# Patient Record
Sex: Female | Born: 1935 | Race: White | Hispanic: No
Health system: Midwestern US, Academic
[De-identification: ages and names within clinical notes are randomized; demographics above are authoritative.]

---

## 2018-04-05 LAB — COMPREHENSIVE METABOLIC PANEL
Lab: 0.7
Lab: 102 — ABNORMAL HIGH (ref 37.0–47.0)
Lab: 108
Lab: 142
Lab: 16 — ABNORMAL HIGH (ref 0–14)
Lab: 22
Lab: 22
Lab: 24 — ABNORMAL HIGH (ref 9.8–20.1)
Lab: 28
Lab: 29 — ABNORMAL LOW (ref >59)
Lab: 4.1
Lab: 7.8
Lab: 9.3

## 2018-04-05 LAB — CBC
Lab: 246
Lab: 31 — ABNORMAL LOW (ref 33.0–37.0)
Lab: 6.8

## 2018-04-06 ENCOUNTER — Encounter: Admit: 2018-04-06 | Discharge: 2018-04-06 | Payer: MEDICARE | Primary: Family

## 2018-04-07 ENCOUNTER — Encounter: Admit: 2018-04-07 | Discharge: 2018-04-07 | Payer: MEDICARE | Primary: Family

## 2018-04-10 LAB — THYROID STIMULATING HORMONE-TSH: Lab: 1.2

## 2018-04-12 ENCOUNTER — Encounter: Admit: 2018-04-12 | Discharge: 2018-04-12 | Payer: MEDICARE | Primary: Family

## 2018-04-21 ENCOUNTER — Encounter: Admit: 2018-04-21 | Discharge: 2018-04-21 | Payer: MEDICARE | Primary: Family

## 2018-04-21 DIAGNOSIS — I2510 Atherosclerotic heart disease of native coronary artery without angina pectoris: ICD-10-CM

## 2018-04-21 DIAGNOSIS — M810 Age-related osteoporosis without current pathological fracture: ICD-10-CM

## 2018-04-21 DIAGNOSIS — E871 Hypo-osmolality and hyponatremia: ICD-10-CM

## 2018-04-21 DIAGNOSIS — R079 Chest pain, unspecified: ICD-10-CM

## 2018-04-21 DIAGNOSIS — G4700 Insomnia, unspecified: ICD-10-CM

## 2018-04-21 DIAGNOSIS — E785 Hyperlipidemia, unspecified: ICD-10-CM

## 2018-04-21 DIAGNOSIS — K219 Gastro-esophageal reflux disease without esophagitis: ICD-10-CM

## 2018-04-21 DIAGNOSIS — I10 Essential (primary) hypertension: ICD-10-CM

## 2018-04-21 DIAGNOSIS — I872 Venous insufficiency (chronic) (peripheral): ICD-10-CM

## 2018-04-21 DIAGNOSIS — I350 Nonrheumatic aortic (valve) stenosis: Principal | ICD-10-CM

## 2018-04-21 DIAGNOSIS — N189 Chronic kidney disease, unspecified: ICD-10-CM

## 2018-04-21 DIAGNOSIS — F329 Major depressive disorder, single episode, unspecified: ICD-10-CM

## 2018-04-21 DIAGNOSIS — E876 Hypokalemia: ICD-10-CM

## 2018-04-21 DIAGNOSIS — E039 Hypothyroidism, unspecified: ICD-10-CM

## 2018-04-27 ENCOUNTER — Encounter: Admit: 2018-04-27 | Discharge: 2018-04-27 | Payer: MEDICARE | Primary: Family

## 2018-04-27 ENCOUNTER — Ambulatory Visit: Admit: 2018-04-27 | Discharge: 2018-04-28 | Payer: MEDICARE | Primary: Family

## 2018-04-27 DIAGNOSIS — I25119 Atherosclerotic heart disease of native coronary artery with unspecified angina pectoris: ICD-10-CM

## 2018-04-27 DIAGNOSIS — N189 Chronic kidney disease, unspecified: ICD-10-CM

## 2018-04-27 DIAGNOSIS — R072 Precordial pain: ICD-10-CM

## 2018-04-27 DIAGNOSIS — R0989 Other specified symptoms and signs involving the circulatory and respiratory systems: Secondary | ICD-10-CM

## 2018-04-27 DIAGNOSIS — Z951 Presence of aortocoronary bypass graft: ICD-10-CM

## 2018-04-27 DIAGNOSIS — F329 Major depressive disorder, single episode, unspecified: ICD-10-CM

## 2018-04-27 DIAGNOSIS — K219 Gastro-esophageal reflux disease without esophagitis: ICD-10-CM

## 2018-04-27 DIAGNOSIS — I350 Nonrheumatic aortic (valve) stenosis: Principal | ICD-10-CM

## 2018-04-27 DIAGNOSIS — E871 Hypo-osmolality and hyponatremia: ICD-10-CM

## 2018-04-27 DIAGNOSIS — M810 Age-related osteoporosis without current pathological fracture: ICD-10-CM

## 2018-04-27 DIAGNOSIS — I872 Venous insufficiency (chronic) (peripheral): ICD-10-CM

## 2018-04-27 DIAGNOSIS — E785 Hyperlipidemia, unspecified: ICD-10-CM

## 2018-04-27 DIAGNOSIS — E7800 Pure hypercholesterolemia, unspecified: ICD-10-CM

## 2018-04-27 DIAGNOSIS — R079 Chest pain, unspecified: ICD-10-CM

## 2018-04-27 DIAGNOSIS — E876 Hypokalemia: ICD-10-CM

## 2018-04-27 DIAGNOSIS — E039 Hypothyroidism, unspecified: ICD-10-CM

## 2018-04-27 DIAGNOSIS — I2510 Atherosclerotic heart disease of native coronary artery without angina pectoris: ICD-10-CM

## 2018-04-27 DIAGNOSIS — R011 Cardiac murmur, unspecified: ICD-10-CM

## 2018-04-27 DIAGNOSIS — I10 Essential (primary) hypertension: ICD-10-CM

## 2018-04-27 DIAGNOSIS — Z9989 Dependence on other enabling machines and devices: ICD-10-CM

## 2018-04-27 DIAGNOSIS — G4700 Insomnia, unspecified: ICD-10-CM

## 2018-04-27 MED ORDER — NITROGLYCERIN 0.4 MG SL SUBL
.4 mg | ORAL_TABLET | SUBLINGUAL | 1 refills | 9.00000 days | Status: AC | PRN
Start: 2018-04-27 — End: ?

## 2018-05-18 ENCOUNTER — Encounter: Admit: 2018-05-18 | Discharge: 2018-05-18 | Payer: MEDICARE | Primary: Family

## 2018-05-18 ENCOUNTER — Ambulatory Visit: Admit: 2018-05-18 | Discharge: 2018-05-19 | Payer: MEDICARE | Primary: Family

## 2018-05-18 DIAGNOSIS — I10 Essential (primary) hypertension: ICD-10-CM

## 2018-05-18 DIAGNOSIS — I25119 Atherosclerotic heart disease of native coronary artery with unspecified angina pectoris: Principal | ICD-10-CM

## 2018-05-19 ENCOUNTER — Encounter: Admit: 2018-05-19 | Discharge: 2018-05-19 | Payer: MEDICARE | Primary: Family

## 2018-05-23 ENCOUNTER — Encounter: Admit: 2018-05-23 | Discharge: 2018-05-23 | Payer: MEDICARE | Primary: Family

## 2018-05-23 DIAGNOSIS — I779 Disorder of arteries and arterioles, unspecified: Principal | ICD-10-CM

## 2018-05-25 ENCOUNTER — Ambulatory Visit: Admit: 2018-05-25 | Discharge: 2018-05-26 | Payer: MEDICARE | Primary: Family

## 2018-05-25 DIAGNOSIS — I10 Essential (primary) hypertension: Principal | ICD-10-CM

## 2018-05-25 DIAGNOSIS — I2510 Atherosclerotic heart disease of native coronary artery without angina pectoris: ICD-10-CM

## 2018-05-26 ENCOUNTER — Encounter: Admit: 2018-05-26 | Discharge: 2018-05-26 | Payer: MEDICARE | Primary: Family

## 2018-05-26 DIAGNOSIS — I10 Essential (primary) hypertension: Principal | ICD-10-CM

## 2018-05-26 DIAGNOSIS — I2510 Atherosclerotic heart disease of native coronary artery without angina pectoris: ICD-10-CM

## 2018-06-05 ENCOUNTER — Encounter: Admit: 2018-06-05 | Discharge: 2018-06-05 | Payer: MEDICARE | Primary: Family

## 2018-06-22 ENCOUNTER — Ambulatory Visit: Admit: 2018-06-22 | Discharge: 2018-06-22 | Payer: MEDICARE | Primary: Family

## 2018-06-22 ENCOUNTER — Encounter: Admit: 2018-06-22 | Discharge: 2018-06-22 | Payer: MEDICARE | Primary: Family

## 2018-06-22 DIAGNOSIS — I10 Essential (primary) hypertension: Secondary | ICD-10-CM

## 2018-06-22 DIAGNOSIS — M810 Age-related osteoporosis without current pathological fracture: Secondary | ICD-10-CM

## 2018-06-22 DIAGNOSIS — R9439 Abnormal result of other cardiovascular function study: Secondary | ICD-10-CM

## 2018-06-22 DIAGNOSIS — N189 Chronic kidney disease, unspecified: Secondary | ICD-10-CM

## 2018-06-22 DIAGNOSIS — Z951 Presence of aortocoronary bypass graft: Secondary | ICD-10-CM

## 2018-06-22 DIAGNOSIS — Z9989 Dependence on other enabling machines and devices: Secondary | ICD-10-CM

## 2018-06-22 DIAGNOSIS — R079 Chest pain, unspecified: Secondary | ICD-10-CM

## 2018-06-22 DIAGNOSIS — F329 Major depressive disorder, single episode, unspecified: Secondary | ICD-10-CM

## 2018-06-22 DIAGNOSIS — R011 Cardiac murmur, unspecified: Secondary | ICD-10-CM

## 2018-06-22 DIAGNOSIS — I25119 Atherosclerotic heart disease of native coronary artery with unspecified angina pectoris: Secondary | ICD-10-CM

## 2018-06-22 DIAGNOSIS — G4700 Insomnia, unspecified: Secondary | ICD-10-CM

## 2018-06-22 DIAGNOSIS — E785 Hyperlipidemia, unspecified: Secondary | ICD-10-CM

## 2018-06-22 DIAGNOSIS — I872 Venous insufficiency (chronic) (peripheral): Secondary | ICD-10-CM

## 2018-06-22 DIAGNOSIS — E871 Hypo-osmolality and hyponatremia: Secondary | ICD-10-CM

## 2018-06-22 DIAGNOSIS — I779 Disorder of arteries and arterioles, unspecified: Secondary | ICD-10-CM

## 2018-06-22 DIAGNOSIS — E039 Hypothyroidism, unspecified: Secondary | ICD-10-CM

## 2018-06-22 DIAGNOSIS — I2510 Atherosclerotic heart disease of native coronary artery without angina pectoris: Secondary | ICD-10-CM

## 2018-06-22 DIAGNOSIS — I350 Nonrheumatic aortic (valve) stenosis: Secondary | ICD-10-CM

## 2018-06-22 DIAGNOSIS — K219 Gastro-esophageal reflux disease without esophagitis: Secondary | ICD-10-CM

## 2018-06-22 DIAGNOSIS — E876 Hypokalemia: Secondary | ICD-10-CM

## 2018-06-22 DIAGNOSIS — E7800 Pure hypercholesterolemia, unspecified: Secondary | ICD-10-CM

## 2018-10-04 ENCOUNTER — Encounter: Admit: 2018-10-04 | Discharge: 2018-10-04 | Payer: MEDICARE | Primary: Family

## 2018-10-06 ENCOUNTER — Encounter: Admit: 2018-10-06 | Discharge: 2018-10-06 | Payer: MEDICARE | Primary: Family

## 2018-10-06 NOTE — Telephone Encounter
Called pt to schedule her followup appt with Dr. Avie Arenas which is due 8 - 10 months after her Jan 2020 appt. Pt says she does not have a computer or email address. Scheduled appt for office visit with Dr. Avie Arenas on 01/16/2019 at 10:30 in our [place] office.

## 2019-01-25 ENCOUNTER — Encounter: Admit: 2019-01-25 | Discharge: 2019-01-25 | Primary: Family

## 2019-02-01 ENCOUNTER — Encounter: Admit: 2019-02-01 | Discharge: 2019-02-01 | Primary: Family

## 2019-02-01 ENCOUNTER — Ambulatory Visit: Admit: 2019-02-01 | Discharge: 2019-02-02 | Primary: Family

## 2019-02-01 DIAGNOSIS — I10 Essential (primary) hypertension: Secondary | ICD-10-CM

## 2019-02-01 DIAGNOSIS — R9439 Abnormal result of other cardiovascular function study: Secondary | ICD-10-CM

## 2019-02-01 DIAGNOSIS — E039 Hypothyroidism, unspecified: Secondary | ICD-10-CM

## 2019-02-01 DIAGNOSIS — G4700 Insomnia, unspecified: Secondary | ICD-10-CM

## 2019-02-01 DIAGNOSIS — F329 Major depressive disorder, single episode, unspecified: Secondary | ICD-10-CM

## 2019-02-01 DIAGNOSIS — I350 Nonrheumatic aortic (valve) stenosis: Secondary | ICD-10-CM

## 2019-02-01 DIAGNOSIS — E876 Hypokalemia: Secondary | ICD-10-CM

## 2019-02-01 DIAGNOSIS — Z951 Presence of aortocoronary bypass graft: Secondary | ICD-10-CM

## 2019-02-01 DIAGNOSIS — I872 Venous insufficiency (chronic) (peripheral): Secondary | ICD-10-CM

## 2019-02-01 DIAGNOSIS — E871 Hypo-osmolality and hyponatremia: Secondary | ICD-10-CM

## 2019-02-01 DIAGNOSIS — R0989 Other specified symptoms and signs involving the circulatory and respiratory systems: Secondary | ICD-10-CM

## 2019-02-01 DIAGNOSIS — N189 Chronic kidney disease, unspecified: Secondary | ICD-10-CM

## 2019-02-01 DIAGNOSIS — R011 Cardiac murmur, unspecified: Secondary | ICD-10-CM

## 2019-02-01 DIAGNOSIS — Z9989 Dependence on other enabling machines and devices: Secondary | ICD-10-CM

## 2019-02-01 DIAGNOSIS — R079 Chest pain, unspecified: Secondary | ICD-10-CM

## 2019-02-01 DIAGNOSIS — K219 Gastro-esophageal reflux disease without esophagitis: Secondary | ICD-10-CM

## 2019-02-01 DIAGNOSIS — I2510 Atherosclerotic heart disease of native coronary artery without angina pectoris: Secondary | ICD-10-CM

## 2019-02-01 DIAGNOSIS — M810 Age-related osteoporosis without current pathological fracture: Secondary | ICD-10-CM

## 2019-02-01 DIAGNOSIS — I25119 Atherosclerotic heart disease of native coronary artery with unspecified angina pectoris: Secondary | ICD-10-CM

## 2019-02-01 DIAGNOSIS — I779 Disorder of arteries and arterioles, unspecified: Secondary | ICD-10-CM

## 2019-02-01 DIAGNOSIS — E7800 Pure hypercholesterolemia, unspecified: Secondary | ICD-10-CM

## 2019-02-01 DIAGNOSIS — E785 Hyperlipidemia, unspecified: Secondary | ICD-10-CM

## 2019-02-01 NOTE — Progress Notes
Date of Service: 02/01/2019    Lori Harper is a 84 y.o. female.       HPI     Lori Harper is an 84 year old white female.  She does have a history of coronary artery disease.  This patient did undergo CABG on 10/30/1988 (LIMA to LAD, SVG to OM branch and diagonal branch and also SVG to RCA).      She was evaluated with a MPI on 05/25/2018, the tomographic pattern demonstrated a large lateral wall perfusion abnormality that was compatible with scar with a very subtle area of reversibility at the low ventricular apex, there are no large areas of ischemia, the left ventricular systolic function was normal with an ejection fraction of 62%.    Patient did undergo several left heart catheterizations in March 2000, October 2002, August 2004, February 2007, March 2008.   ???  ???The most recent left heart catheterization was performed in October 2009, at that time she was found to have severe three-vessel coronary artery disease, the LIMA to LAD was patent, the SVG graft to RCA was occluded, patent SVG to the OM branch with occlusion of the second limb going to OM 2.  ???  Patient also underwent PCI of the SVG to diagonal and obtuse marginal branch in October 2002, then a repeat PCI in August 2004 for instent  restenosis, then repeated PCI's in May 2006 of the SVG of the grafts to obtuse marginal branches, another PCI in February 2007 of the SVG to diagonal as well as the native left circumflex artery. ???In one  of the records from the outside hospital it is also mentioned that patient did have a redo bypass in 1999, details are not known.    She was also evaluated with an echocardiogram on 05/18/2018, the ejection fraction was normal, the aortic valve was mildly sclerotic with mildly increased mean gradient, at that time it was 15 mmHg.    Patient is asymptomatic from a cardiac standpoint, she not experience angina and has not taken any sublingual nitroglycerin. The most recent cholesterol profile dated 01/08/2019 revealed a total cholesterol 185 mg/dL, triglycerides 161 mg/dL, HDL 40 mg/dL, and LDL 87 mg/dL.  Patient is currently on statin therapy.         Vitals:    02/01/19 0954 02/01/19 1008   BP: 136/76 138/78   BP Source: Arm, Left Upper Arm, Right Upper   Pulse: 64    Temp: 36.4 ???C (97.5 ???F)    SpO2: 98%    Weight: 61 kg (134 lb 6.4 oz)    Height: 1.524 m (5')    PainSc: Zero      Body mass index is 26.25 kg/m???.     Past Medical History  Patient Active Problem List    Diagnosis Date Noted   ??? Abnormal thallium stress test 06/22/2018   ??? Carotid artery disease (HCC) 05/23/2018     05/18/2018 Carotid duplex Right:  There is calcified plaque at the proximal internal carotid artery causing less than 50 percent stenosis.  There is scattered noncalcified and calcified plaque causing less than 50 percent stenosis along the common carotid artery.  Left:  There is heterogeneous calcified and noncalcified plaque at the carotid bulb and extending into the proximal internal carotid artery causing less than 50 percent estimated grayscale stenosis.       ??? Hx of CABG 04/27/2018   ??? Systolic murmur 04/27/2018   ??? Bruit of left carotid artery 04/27/2018   ??? Uses walker  04/27/2018   ??? Aortic stenosis 04/21/2018     01/2005 Echo showed normal LV systolic function. LV is normal in size.  Impaired LV relaxation.  Left atrium is mildly dilated.  There is mild mitral regurgitation by color flow.  There is mild mitral annular calcification      ??? Coronary artery disease 04/21/2018     04/2000    1999 quintuple coronary artery bypass surgery including left internal mammary artery to the left anterior descending, sequential saphenous vein grafts to the first obtuse marginal, second obtuse marginal and first diagonal arteries, and a separate saphenous vein graft to the right coronary artery.    1999 Acute inferior wall MI.  07/1997  Occluded saphenous vein graft to the RCA. 04/2000  Occluded graft to the RCA  03/2001  Angioplasty with deployment of a total of four stents.  01/2203  Thallium showed inferior lateral and apical defect with mid to mod degree of ischemia suggesting peri-infarct ischemia.   01/2003  Cath demonstrated occlusion of all epicardial coronary arteries, patent left internal mammary artery graft to the LAD, patient saphenous vein graft to the first obtuse marginal with occluded sequential arms to the second obtuse marginal and to the diagonal artery, occluded saphenous vein graft to the RCA.  The patient has been treated medically.         ??? Chronic kidney disease 04/21/2018   ??? GERD (gastroesophageal reflux disease) 04/21/2018   ??? Hypertension 04/21/2018   ??? Hyperlipidemia 04/21/2018   ??? Hypo-osmolality and hyponatremia 04/21/2018   ??? Hypokalemia 04/21/2018   ??? Hypothyroidism 04/21/2018   ??? Insomnia 04/21/2018   ??? MDD (major depressive disorder) 04/21/2018   ??? Osteoporosis 04/21/2018   ??? Venous insufficiency 04/21/2018   ??? Chest pain 04/21/2018         Review of Systems   Constitution: Negative.   HENT: Positive for hearing loss.    Eyes: Negative.    Cardiovascular: Positive for irregular heartbeat.   Respiratory: Negative.    Endocrine: Negative.    Hematologic/Lymphatic: Negative.    Skin: Negative.    Musculoskeletal: Positive for arthritis, falls, joint pain and joint swelling.   Gastrointestinal: Negative.    Genitourinary: Negative.    Neurological: Negative.    Psychiatric/Behavioral: The patient is nervous/anxious.    Allergic/Immunologic: Negative.        Physical Exam    General Appearance: normal in appearance  Skin: warm, moist, no ulcers or xanthomas  Eyes: conjunctivae and lids normal, pupils are equal and round  Lips & Oral Mucosa: no pallor or cyanosis  Neck Veins: neck veins are flat, neck veins are not distended  Chest Inspection: chest is normal in appearance  Respiratory Effort: breathing comfortably, no respiratory distress Auscultation/Percussion: lungs clear to auscultation, no rales or rhonchi, no wheezing  Cardiac Rhythm: regular rhythm and normal rate  Cardiac Auscultation: S1, S2 normal, no rub, no gallop  Murmurs: II.VI systolic murmur at the RUSB  Carotid Arteries: normal carotid upstroke bilaterally, L bruit  Lower Extremity Edema: no lower extremity edema  Abdominal Exam: soft, non-tender, no masses, bowel sounds normal  Liver & Spleen: no organomegaly  Language and Memory: patient responsive and seems to comprehend information  Neurologic Exam: neurological assessment grossly intact    Cardiovascular Studies      Problems Addressed Today  Encounter Diagnoses   Name Primary?   ??? Nonrheumatic aortic valve stenosis Yes   ??? Coronary artery disease involving native heart with angina pectoris, unspecified vessel  or lesion type [facility])    ??? Essential hypertension    ??? Pure hypercholesterolemia    ??? Hx of CABG    ??? Carotid artery disease, unspecified laterality, unspecified type (HCC)    ??? Chronic kidney disease, unspecified CKD stage    ??? Systolic murmur    ??? Bruit of left carotid artery    ??? Uses walker    ??? Abnormal thallium stress test        Assessment and Plan     In summary:  This is an 84 year old white female with an extensive history of coronary artery disease, surgical revascularization in Oct 30, 1988 and percutaneous revascularization performed several times in the years to follow, patient is known to have an occluded SVG graft to RCA, a jump graft from an SVG to the second limb of the OM 2 branch is known to be occluded, patient also did undergo previous PCI of the SVG to diagonal obtuse marginal branches.  ???  The most recent thallium stress test performed did reveal lateral wall scar which is compatible with patient's anatomy.    From a cardiac standpoint this patient has remained asymptomatic, she does have osteoarthritis related symptoms.  She is also known to have a left carotid bruit.    Plan: 1.  Continue all current medications  2.  Further evaluation with a carotid artery duplex and 2D echo Doppler study prior to the next office visit with me in approximately 6 to 7 months.  3.  In regards to the elevated triglycerides I did asked the patient to follow a low-fat, low carbohydrate, low sugar and low-salt diet, she can have the cholesterol profile rechecked in 3 months, if the triglycerides are not improved then I suggest to initiate fenofibrate 145 mg 1 tablet p.o. daily.           Current Medications (including today's revisions)  ??? alendronate (FOSAMAX) 70 mg tablet Take 70 mg by mouth every 7 days. Take at least 30 minutes before breakfast with plain water. Do not lie down for 30 minutes.   ??? aspirin EC 81 mg tablet Take 81 mg by mouth daily. Take with food.   ??? buPROPion XL (WELLBUTRIN XL) 300 mg tablet Take 300 mg by mouth daily.   ??? busPIRone (BUSPAR) 7.5 mg tablet Take 7.5 mg by mouth twice daily.   ??? cholecalciferol(+) (VITAMIN D-3) 2,000 unit tablet Take 2,000 Units by mouth daily.   ??? fluticasone propionate (FLONASE) 50 mcg/actuation nasal spray Apply 2 sprays to each nostril as directed daily. Shake bottle gently before using.   ??? furosemide (LASIX) 40 mg tablet Take 40 mg by mouth daily.   ??? gabapentin (NEURONTIN) 300 mg capsule Take 1 capsule by mouth at bedtime daily.   ??? isosorbide mononitrate CR (IMDUR) 120 mg tablet Take 120 mg by mouth twice daily.   ??? KLOR-CON M20 20 mEq tablet Take 20 mEq by mouth daily.   ??? levothyroxine (SYNTHROID) 50 mcg tablet Take 50 mcg by mouth daily.   ??? loratadine (CLARITIN) 10 mg tablet Take 10 mg by mouth every morning.   ??? LORazepam (ATIVAN) 0.5 mg tablet Take 1 tablet by mouth twice daily.   ??? magnesium hydroxide (MILK OF MAGNESIA PO) Take  by mouth as Needed.   ??? metoprolol tartrate (LOPRESSOR) 50 mg tablet TAKE 1 TABLET BY MOUTH IN THE MORNING AND 1/2 TABS ON THE EVENING   ??? multivit,calc,mins/folic acid (ONE-A-DAY PROACTIVE 65 PLUS PO) Take 1 tablet by  mouth daily.   ??? nitroglycerin (NITROSTAT) 0.4 mg tablet Place one tablet under tongue every 5 minutes as needed for Chest Pain. Max of 3 tablets, call 911.   ??? Omega-3-DHA-EPA-Fish Oil 1,000 mg (120 mg-180 mg) cap Take 1 capsule by mouth daily.   ??? Simethicone 125 mg cap Take 1 capsule by mouth daily.   ??? simvastatin (ZOCOR) 20 mg tablet Take 20 mg by mouth daily.   ??? tiZANidine (ZANAFLEX) 4 mg tablet Take 4 mg by mouth every 8 hours as needed.   ??? Trolamine Salicylate-Aloe Vera 10 % crea Apply  topically to affected area.   ??? Vit  A,C,E-Zinc-Copper (PRESERVISION AREDS) 14,[id number] unit-mg-unit cap Take 1 capsule by mouth daily.

## 2019-09-06 ENCOUNTER — Encounter: Admit: 2019-09-06 | Discharge: 2019-09-06 | Payer: MEDICARE | Primary: Family

## 2019-09-07 ENCOUNTER — Encounter: Admit: 2019-09-07 | Discharge: 2019-09-07 | Payer: MEDICARE | Primary: Family

## 2019-09-07 ENCOUNTER — Ambulatory Visit: Admit: 2019-09-07 | Discharge: 2019-09-07 | Payer: MEDICARE | Primary: Family

## 2019-09-07 DIAGNOSIS — Z951 Presence of aortocoronary bypass graft: Secondary | ICD-10-CM

## 2019-09-07 DIAGNOSIS — I350 Nonrheumatic aortic (valve) stenosis: Secondary | ICD-10-CM

## 2019-09-07 DIAGNOSIS — N189 Chronic kidney disease, unspecified: Secondary | ICD-10-CM

## 2019-09-07 DIAGNOSIS — R0989 Other specified symptoms and signs involving the circulatory and respiratory systems: Secondary | ICD-10-CM

## 2019-09-07 DIAGNOSIS — I10 Essential (primary) hypertension: Secondary | ICD-10-CM

## 2019-09-07 DIAGNOSIS — R9439 Abnormal result of other cardiovascular function study: Secondary | ICD-10-CM

## 2019-09-07 DIAGNOSIS — E785 Hyperlipidemia, unspecified: Secondary | ICD-10-CM

## 2019-09-07 DIAGNOSIS — E7800 Pure hypercholesterolemia, unspecified: Secondary | ICD-10-CM

## 2019-09-07 DIAGNOSIS — I25119 Atherosclerotic heart disease of native coronary artery with unspecified angina pectoris: Secondary | ICD-10-CM

## 2019-09-07 DIAGNOSIS — R079 Chest pain, unspecified: Secondary | ICD-10-CM

## 2019-09-07 DIAGNOSIS — M79606 Pain in leg, unspecified: Secondary | ICD-10-CM

## 2019-09-07 DIAGNOSIS — I779 Disorder of arteries and arterioles, unspecified: Secondary | ICD-10-CM

## 2019-09-07 DIAGNOSIS — G4700 Insomnia, unspecified: Secondary | ICD-10-CM

## 2019-09-07 DIAGNOSIS — K219 Gastro-esophageal reflux disease without esophagitis: Secondary | ICD-10-CM

## 2019-09-07 DIAGNOSIS — I872 Venous insufficiency (chronic) (peripheral): Secondary | ICD-10-CM

## 2019-09-07 DIAGNOSIS — Z9989 Dependence on other enabling machines and devices: Secondary | ICD-10-CM

## 2019-09-07 DIAGNOSIS — E039 Hypothyroidism, unspecified: Secondary | ICD-10-CM

## 2019-09-07 DIAGNOSIS — M810 Age-related osteoporosis without current pathological fracture: Secondary | ICD-10-CM

## 2019-09-07 DIAGNOSIS — R011 Cardiac murmur, unspecified: Secondary | ICD-10-CM

## 2019-09-07 DIAGNOSIS — E871 Hypo-osmolality and hyponatremia: Secondary | ICD-10-CM

## 2019-09-07 DIAGNOSIS — F329 Major depressive disorder, single episode, unspecified: Secondary | ICD-10-CM

## 2019-09-07 DIAGNOSIS — E876 Hypokalemia: Secondary | ICD-10-CM

## 2019-09-07 DIAGNOSIS — I2510 Atherosclerotic heart disease of native coronary artery without angina pectoris: Secondary | ICD-10-CM

## 2019-09-07 MED ORDER — PERFLUTREN LIPID MICROSPHERES 1.1 MG/ML IV SUSP
1-20 mL | Freq: Once | INTRAVENOUS | 0 refills | Status: CP | PRN
Start: 2019-09-07 — End: ?

## 2019-09-19 ENCOUNTER — Encounter: Admit: 2019-09-19 | Discharge: 2019-09-19 | Payer: MEDICARE | Primary: Family

## 2020-04-07 ENCOUNTER — Encounter: Admit: 2020-04-07 | Discharge: 2020-04-07 | Payer: MEDICARE | Primary: Family

## 2020-04-07 MED ORDER — AMLODIPINE 10 MG PO TAB
10 mg | ORAL_TABLET | Freq: Every day | ORAL | 3 refills | Status: AC
Start: 2020-04-07 — End: ?

## 2020-04-07 NOTE — Telephone Encounter
Pt has hx of 4 ER visits with asymptomatic hypertension in the evening with bp's 200's /100's P60's.  Pt's appt 11/4 [facility] notified orders received. Pt notified.

## 2020-04-16 ENCOUNTER — Encounter: Admit: 2020-04-16 | Discharge: 2020-04-16 | Payer: MEDICARE | Primary: Family

## 2020-04-16 NOTE — Progress Notes
Records Request    Medical records request for continuation of care:    Patient has appointment on 04/17/2020   with  Dr. Nickolas Madrid* .    Please fax records to Cardiovascular Medicine [facility] [phone]    Request records: Lori Harper          EKG's (August-November 2021)          Most recent Lipid Panel              Thank you,      Cardiovascular Medicine  [facility]  [address]  [place], [place] [address]  Phone:  [phone]  Fax:  [phone]

## 2020-04-17 ENCOUNTER — Encounter: Admit: 2020-04-17 | Discharge: 2020-04-17 | Payer: MEDICARE | Primary: Family

## 2020-04-17 DIAGNOSIS — I779 Disorder of arteries and arterioles, unspecified: Secondary | ICD-10-CM

## 2020-04-17 DIAGNOSIS — M810 Age-related osteoporosis without current pathological fracture: Secondary | ICD-10-CM

## 2020-04-17 DIAGNOSIS — Z7722 Contact with and (suspected) exposure to environmental tobacco smoke (acute) (chronic): Secondary | ICD-10-CM

## 2020-04-17 DIAGNOSIS — F329 Major depressive disorder, single episode, unspecified: Secondary | ICD-10-CM

## 2020-04-17 DIAGNOSIS — I10 Essential (primary) hypertension: Secondary | ICD-10-CM

## 2020-04-17 DIAGNOSIS — I872 Venous insufficiency (chronic) (peripheral): Secondary | ICD-10-CM

## 2020-04-17 DIAGNOSIS — E785 Hyperlipidemia, unspecified: Secondary | ICD-10-CM

## 2020-04-17 DIAGNOSIS — Z09 Encounter for follow-up examination after completed treatment for conditions other than malignant neoplasm: Secondary | ICD-10-CM

## 2020-04-17 DIAGNOSIS — R0989 Other specified symptoms and signs involving the circulatory and respiratory systems: Secondary | ICD-10-CM

## 2020-04-17 DIAGNOSIS — Z9989 Dependence on other enabling machines and devices: Secondary | ICD-10-CM

## 2020-04-17 DIAGNOSIS — E871 Hypo-osmolality and hyponatremia: Secondary | ICD-10-CM

## 2020-04-17 DIAGNOSIS — G4700 Insomnia, unspecified: Secondary | ICD-10-CM

## 2020-04-17 DIAGNOSIS — N189 Chronic kidney disease, unspecified: Secondary | ICD-10-CM

## 2020-04-17 DIAGNOSIS — I2510 Atherosclerotic heart disease of native coronary artery without angina pectoris: Secondary | ICD-10-CM

## 2020-04-17 DIAGNOSIS — I350 Nonrheumatic aortic (valve) stenosis: Secondary | ICD-10-CM

## 2020-04-17 DIAGNOSIS — I25119 Atherosclerotic heart disease of native coronary artery with unspecified angina pectoris: Secondary | ICD-10-CM

## 2020-04-17 DIAGNOSIS — K219 Gastro-esophageal reflux disease without esophagitis: Secondary | ICD-10-CM

## 2020-04-17 DIAGNOSIS — Z951 Presence of aortocoronary bypass graft: Secondary | ICD-10-CM

## 2020-04-17 DIAGNOSIS — E7800 Pure hypercholesterolemia, unspecified: Secondary | ICD-10-CM

## 2020-04-17 DIAGNOSIS — R9439 Abnormal result of other cardiovascular function study: Secondary | ICD-10-CM

## 2020-04-17 DIAGNOSIS — R079 Chest pain, unspecified: Secondary | ICD-10-CM

## 2020-04-17 DIAGNOSIS — R011 Cardiac murmur, unspecified: Secondary | ICD-10-CM

## 2020-04-17 DIAGNOSIS — E039 Hypothyroidism, unspecified: Secondary | ICD-10-CM

## 2020-04-17 DIAGNOSIS — E876 Hypokalemia: Secondary | ICD-10-CM

## 2020-04-17 NOTE — Progress Notes
Date of Service: 04/17/2020    Lori Harper is a 85 y.o. female.       HPI     Since August 2021 Lori Harper was admitted/evaluated in the emergency room department at the local hospital 6 times between January 24, 2020 and 04/13/2020.    During the admission and August, patient also reported chest pain, she was treated with enoxaparin for 48 hours, aspirin beta-blocker and also an echocardiogram was performed along with a perfusion imaging study.  The echocardiogram showed normal heart function, the aortic valve is known to be sclerotic and mildly stenotic with a mean gradient of 6 mmHg.    The MPI dated 01/22/2020 demonstrated a fixed lateral wall perfusion abnormality, this is similar to a previous study performed in February 2019 and compatible with patient's known history of underlying coronary artery disease.    The other admissions and ER department evaluation occurred on 03/08/2020, 03/15/2020, 03/27/2020 04/03/2020 and 04/13/2020.  These ER evaluations were prompted by symptoms of dizziness, weakness, chest pain, heart palpitations.  The blood pressure ranged between 159/67 mmHg to 205/90 mmHg.  Patient was treated in the emergency room with antihypertensive drug therapy and she was discharged home in stable condition on each of these occasions.    During the last presentation to the emergency room department I was contacted by the ER staff and I suggested to add amlodipine 5 mg p.o. twice daily.  At present time patient is not on this medication.    Today here in the office Lori Harper reports doing well from a cardiac standpoint.  She has not had any symptoms of chest pain, she has not experience heart palpitations.  Blood pressure today is 124/66 mmHg in the left upper extremity and 124/62 mmHg in the right upper extremity with a heart rate of 62 bpm.    At present time patient does take furosemide, isosorbide mononitrate metoprolol and statin therapy.    She is also exposed to heavy secondhand smoking, patient does live with her son and her granddaughter.    Evaluation with a carotid duplex on 09/07/2019 demonstrated atheromatous plaque in bilateral common carotid arteries without any significant stenosis, mild to moderate calcified/heterogeneous plaque was noted in the proximal carotid arteries with mild flow acceleration.    Patient did undergo several left heart catheterizations in March 2000, October 2002, August 2004, February 2007, March 2008.   ?  ?The most recent left heart catheterization was performed in October 2009, at that time she was found to have severe three-vessel coronary artery disease, the LIMA to LAD was patent, the SVG graft to RCA was occluded, patent SVG to the OM branch with occlusion of the second limb going to OM 2.  ?  Patient also underwent PCI of the SVG to diagonal and obtuse marginal branch in October 2002, then a repeat PCI in August 2004 for?instent??restenosis, then repeated PCI's in May 2006 of the SVG of the grafts to obtuse marginal branches, another PCI in February 2007 of the SVG to diagonal as well as the native left circumflex artery. ?In?one??of the records from the outside hospital it is also mentioned that patient did have a redo bypass in 1999, details are not known.         Vitals:    04/17/20 1434 04/17/20 1446   BP: 124/66 124/62   BP Source: Arm, Left Upper Arm, Right Upper   Patient Position: Sitting Sitting   Pulse: 62    SpO2: 98%  Weight: 64 kg (141 lb 3.2 oz)    Height: 1.499 m (4' 11)    PainSc: Zero      Body mass index is 28.52 kg/m?Marland Kitchen     Past Medical History  Patient Active Problem List    Diagnosis Date Noted   ? Abnormal thallium stress test 06/22/2018   ? Carotid artery disease (HCC) 05/23/2018     05/18/2018 Carotid duplex Right:  There is calcified plaque at the proximal internal carotid artery causing less than 50 percent stenosis.  There is scattered noncalcified and calcified plaque causing less than 50 percent stenosis along the common carotid artery.  Left:  There is heterogeneous calcified and noncalcified plaque at the carotid bulb and extending into the proximal internal carotid artery causing less than 50 percent estimated grayscale stenosis.       ? Hx of CABG 04/27/2018   ? Systolic murmur 04/27/2018   ? Bruit of left carotid artery 04/27/2018   ? Walker as ambulation aid 04/27/2018   ? Aortic stenosis 04/21/2018     01/2005 Echo showed normal LV systolic function. LV is normal in size.  Impaired LV relaxation.  Left atrium is mildly dilated.  There is mild mitral regurgitation by color flow.  There is mild mitral annular calcification      ? Coronary artery disease 04/21/2018     04/2000    1999 quintuple coronary artery bypass surgery including left internal mammary artery to the left anterior descending, sequential saphenous vein grafts to the first obtuse marginal, second obtuse marginal and first diagonal arteries, and a separate saphenous vein graft to the right coronary artery.    1999 Acute inferior wall MI.  07/1997  Occluded saphenous vein graft to the RCA.   04/2000  Occluded graft to the RCA  03/2001  Angioplasty with deployment of a total of four stents.  01/2203  Thallium showed inferior lateral and apical defect with mid to mod degree of ischemia suggesting peri-infarct ischemia.   01/2003  Cath demonstrated occlusion of all epicardial coronary arteries, patent left internal mammary artery graft to the LAD, patient saphenous vein graft to the first obtuse marginal with occluded sequential arms to the second obtuse marginal and to the diagonal artery, occluded saphenous vein graft to the RCA.  The patient has been treated medically.         ? Chronic kidney disease 04/21/2018   ? GERD (gastroesophageal reflux disease) 04/21/2018   ? Hypertension 04/21/2018   ? Hyperlipidemia 04/21/2018   ? Hypo-osmolality and hyponatremia 04/21/2018   ? Hypokalemia 04/21/2018   ? Hypothyroidism 04/21/2018   ? Insomnia 04/21/2018   ? MDD (major depressive disorder) 04/21/2018   ? Osteoporosis 04/21/2018   ? Venous insufficiency 04/21/2018   ? Chest pain 04/21/2018         Review of Systems   Constitutional: Positive for malaise/fatigue.   HENT: Positive for tinnitus.    Eyes: Negative.    Cardiovascular: Positive for leg swelling.   Respiratory: Negative.    Endocrine: Negative.    Hematologic/Lymphatic: Negative.    Skin: Negative.    Musculoskeletal: Positive for gout.   Gastrointestinal: Negative.    Genitourinary: Negative.    Neurological: Positive for excessive daytime sleepiness and dizziness.   Psychiatric/Behavioral: Positive for depression and memory loss.   Allergic/Immunologic: Negative.        Physical Exam  General Appearance: normal in appearance , patient uses a walker as an ambulatory aid  Skin: warm, moist,  no ulcers or xanthomas  Eyes: conjunctivae and lids normal, pupils are equal and round  Lips & Oral Mucosa: no pallor or cyanosis  Neck Veins: neck veins are flat, neck veins are not distended  Chest Inspection: chest is normal in appearance  Respiratory Effort: breathing comfortably, no respiratory distress  Auscultation/Percussion: lungs clear to auscultation, no rales or rhonchi, no wheezing  Cardiac Rhythm: regular rhythm and normal rate  Cardiac Auscultation: S1, S2 normal, no rub, no gallop  Murmurs: 2/6 systolic murmur at the right upper sternal border  Carotid Arteries: normal carotid upstroke bilaterally, bilateral carotid bruit  Lower Extremity Edema: no lower extremity edema  Abdominal Exam: soft, non-tender, no masses, bowel sounds normal  Liver & Spleen: no organomegaly  Language and Memory: patient responsive and seems to comprehend information  Neurologic Exam: neurological assessment grossly intact      Cardiovascular Studies      Cardiovascular Health Factors  Vitals BP Readings from Last 3 Encounters:   04/17/20 124/62   09/07/19 (!) 157/88   09/07/19 (!) 150/87     Wt Readings from Last 3 Encounters:   04/17/20 64 kg (141 lb 3.2 oz)   09/07/19 59.2 kg (130 lb 9.6 oz)   09/07/19 59.2 kg (130 lb 9.6 oz)     BMI Readings from Last 3 Encounters:   04/17/20 28.52 kg/m?   09/07/19 26.38 kg/m?   09/07/19 26.38 kg/m?      Smoking Social History     Tobacco Use   Smoking Status Never Smoker   Smokeless Tobacco Never Used      Lipid Profile Cholesterol   Date Value Ref Range Status   01/08/2019 185  Final     HDL   Date Value Ref Range Status   01/08/2019 40  Final     LDL   Date Value Ref Range Status   01/08/2019 87  Final     Triglycerides   Date Value Ref Range Status   01/08/2019 289 (H) <150 Final      Blood Sugar No results found for: HGBA1C  Glucose   Date Value Ref Range Status   08/14/2019 87  Final   01/08/2019 93  Final   04/05/2018 105  Final          Problems Addressed Today  Encounter Diagnoses   Name Primary?   ? Nonrheumatic aortic valve stenosis Yes   ? Carotid artery disease, unspecified laterality, unspecified type (HCC)    ? Coronary artery disease involving native heart with angina pectoris, unspecified vessel or lesion type (HCC)    ? Hx of CABG    ? Pure hypercholesterolemia    ? Primary hypertension    ? Venous insufficiency    ? Abnormal thallium stress test    ? Bruit of left carotid artery    ? Chronic kidney disease, unspecified CKD stage    ? Walker as ambulation aid    ? Systolic murmur    ? Hospital discharge follow-up    ? Secondhand exposure to electronic cigarette smoke        Assessment and Plan     In summary: This is an 85 year old white female that underwent hospitalization in August and then few subsequent ER department evaluation due to poorly controlled hypertension, please see above details.    At present time patient remained stable from a cardiac standpoint, her blood pressure is under good control, she is angina free and essentially asymptomatic.    Plan:  1.  I recommended to continue all current cardiac medications  2.  Follow-up office visit in 3 months.      Total Time Today was 45 minutes in the following activities: Preparing to see the patient, Obtaining and/or reviewing separately obtained history, Performing a medically appropriate examination and/or evaluation, Counseling and educating the patient/family/caregiver, Documenting clinical information in the electronic or other health record and Care coordination (not separately reported)                  Current Medications (including today's revisions)  ? allopurinoL (ZYLOPRIM) 100 mg tablet Take 1 tablet by mouth daily.   ? aspirin EC 81 mg tablet Take 81 mg by mouth daily. Take with food.   ? buPROPion XL (WELLBUTRIN XL) 300 mg tablet Take 300 mg by mouth daily.   ? busPIRone (BUSPAR) 7.5 mg tablet Take 7.5 mg by mouth twice daily.   ? cholecalciferol(+) (VITAMIN D-3) 2,000 unit tablet Take 2,000 Units by mouth daily.   ? diclofenac (VOLTAREN) 1 % topical gel Apply 4 g topically to affected area four times daily.   ? famotidine (PEPCID) 40 mg tablet Take 40 mg by mouth daily.   ? fluticasone propionate (FLONASE) 50 mcg/actuation nasal spray Apply 2 sprays to each nostril as directed daily. Shake bottle gently before using.   ? furosemide (LASIX) 40 mg tablet Take 40 mg by mouth daily.   ? gabapentin (NEURONTIN) 300 mg capsule Take 1 capsule by mouth twice daily. Patient takes 1 capsule in the morning and 2 capsules in the evening   ? isosorbide mononitrate CR (IMDUR) 120 mg tablet Take 120 mg by mouth twice daily.   ? KLOR-CON M20 20 mEq tablet Take 20 mEq by mouth daily.   ? levothyroxine (SYNTHROID) 50 mcg tablet Take 50 mcg by mouth daily.   ? loratadine (CLARITIN) 10 mg tablet Take 10 mg by mouth every morning.   ? LORazepam (ATIVAN) 0.5 mg tablet Take 1 tablet by mouth every 12 hours as needed.   ? metoprolol tartrate (LOPRESSOR) 50 mg tablet TAKE 1 TABLET BY MOUTH IN THE MORNING AND 1/2 TABS ON THE EVENING   ? multivit,calc,mins/folic acid (ONE-A-DAY PROACTIVE 65 PLUS PO) Take 1 tablet by mouth daily.   ? nitroglycerin (NITROSTAT) 0.4 mg tablet Place one tablet under tongue every 5 minutes as needed for Chest Pain. Max of 3 tablets, call 911.   ? Omega-3-DHA-EPA-Fish Oil 1,000 mg (120 mg-180 mg) cap Take 1 capsule by mouth daily.   ? simvastatin (ZOCOR) 20 mg tablet Take 20 mg by mouth daily.   ? Trolamine Salicylate-Aloe Vera 10 % crea Apply  topically to affected area as Needed.   ? Vit  A,C,E-Zinc-Copper (PRESERVISION AREDS) 14,320-226-200 unit-mg-unit cap Take 1 capsule by mouth daily.

## 2020-04-22 ENCOUNTER — Encounter: Admit: 2020-04-22 | Discharge: 2020-04-22 | Payer: MEDICARE | Primary: Family

## 2020-06-15 IMAGING — CR [ID]
1 series · 1 of 1 positions shown · non-contrast
Comparison: none

[chest ap]
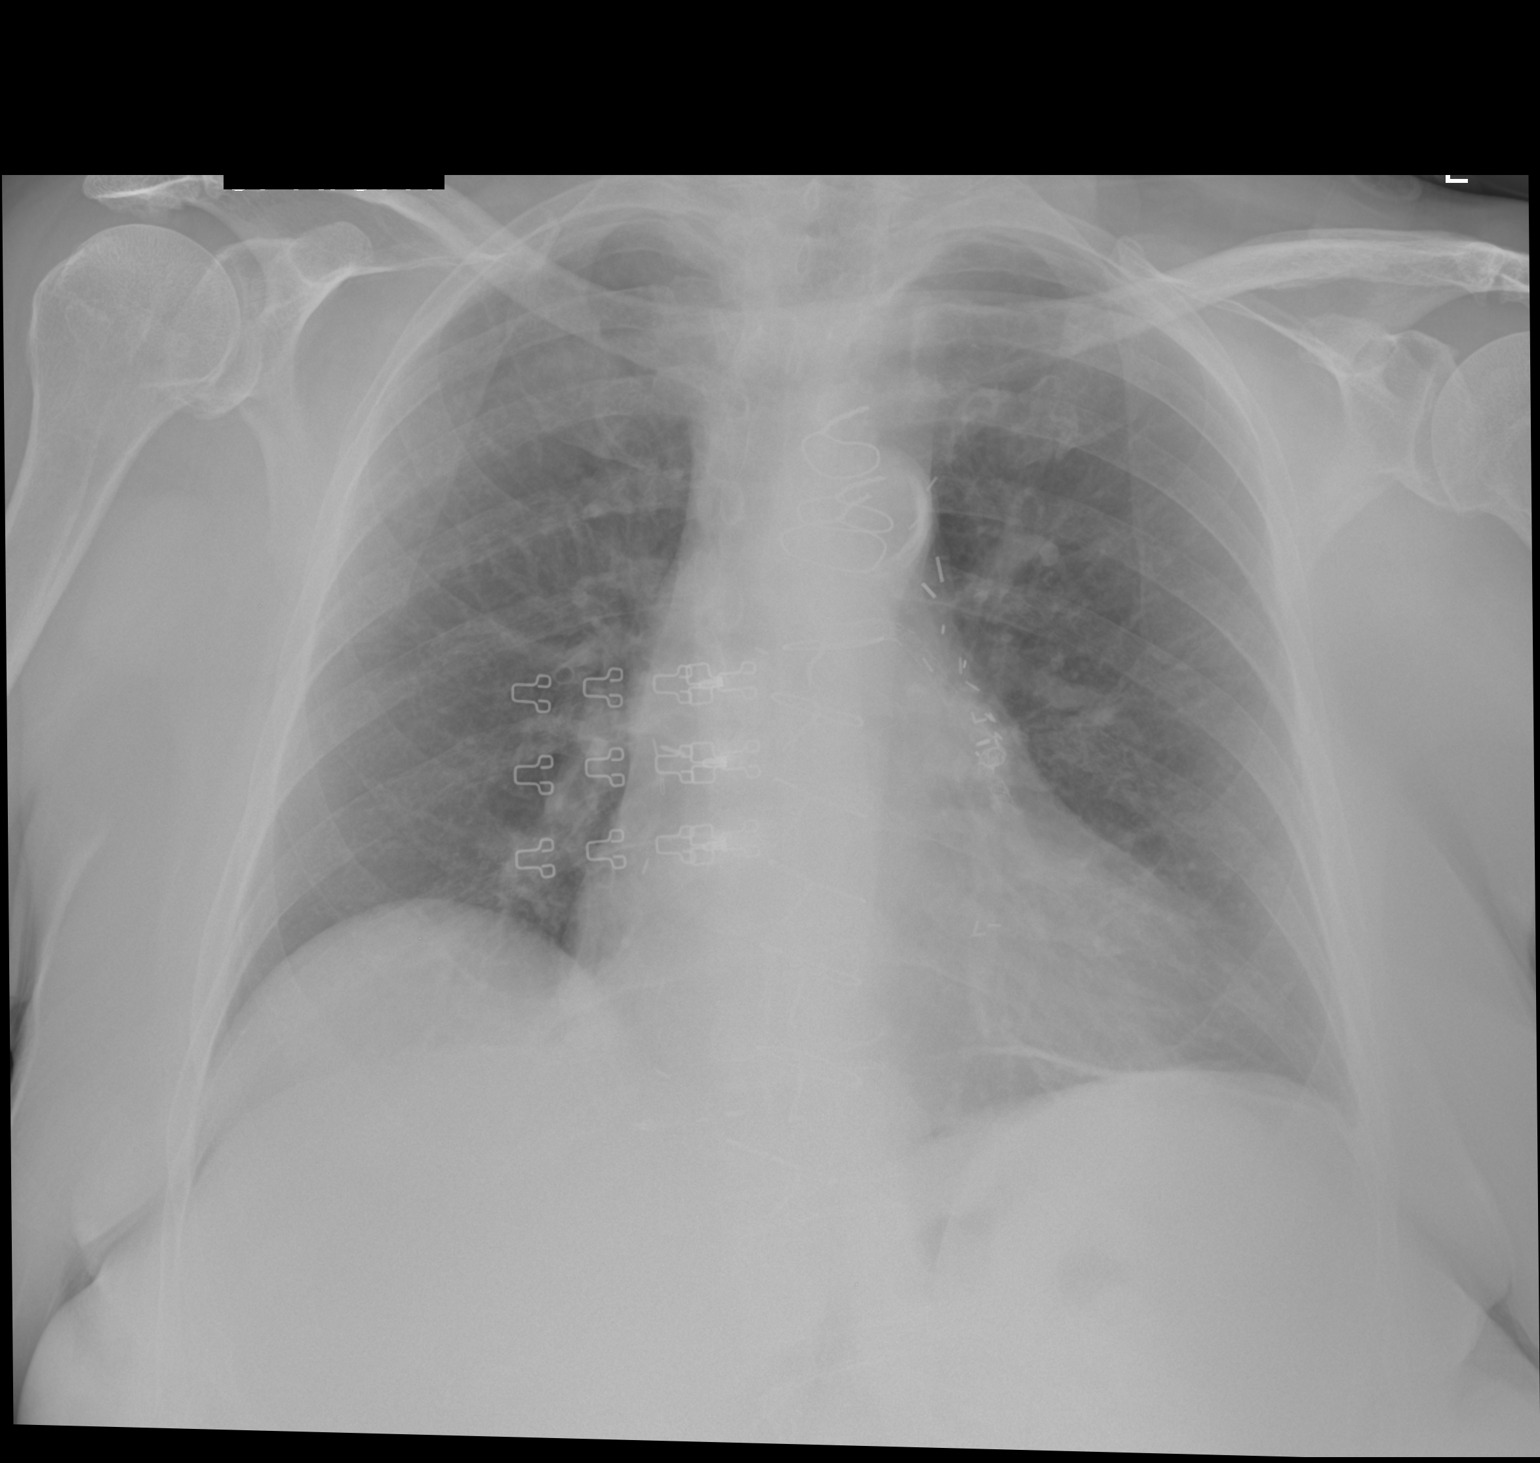

[1 of 1 positions shown; findings below may reference images not displayed]

DIAGNOSTIC STUDIES

EXAM

XR chest 1V

INDICATION

chest warmth
chest tightness  TR

TECHNIQUE

COMPARISONS

03/27/2020

FINDINGS

No focal airspace disease. No pleural effusion or pneumothorax. Stable cardiomediastinal silhouette

IMPRESSION

No focal consolidation

Tech Notes:

chest tightness
TR

## 2020-08-14 ENCOUNTER — Encounter: Admit: 2020-08-14 | Discharge: 2020-08-14 | Payer: MEDICARE | Primary: Family

## 2020-08-14 DIAGNOSIS — Z9989 Dependence on other enabling machines and devices: Secondary | ICD-10-CM

## 2020-08-14 DIAGNOSIS — N189 Chronic kidney disease, unspecified: Secondary | ICD-10-CM

## 2020-08-14 DIAGNOSIS — E876 Hypokalemia: Secondary | ICD-10-CM

## 2020-08-14 DIAGNOSIS — I779 Disorder of arteries and arterioles, unspecified: Secondary | ICD-10-CM

## 2020-08-14 DIAGNOSIS — I350 Nonrheumatic aortic (valve) stenosis: Secondary | ICD-10-CM

## 2020-08-14 DIAGNOSIS — M810 Age-related osteoporosis without current pathological fracture: Secondary | ICD-10-CM

## 2020-08-14 DIAGNOSIS — R9439 Abnormal result of other cardiovascular function study: Secondary | ICD-10-CM

## 2020-08-14 DIAGNOSIS — Z951 Presence of aortocoronary bypass graft: Secondary | ICD-10-CM

## 2020-08-14 DIAGNOSIS — E871 Hypo-osmolality and hyponatremia: Secondary | ICD-10-CM

## 2020-08-14 DIAGNOSIS — R079 Chest pain, unspecified: Secondary | ICD-10-CM

## 2020-08-14 DIAGNOSIS — K219 Gastro-esophageal reflux disease without esophagitis: Secondary | ICD-10-CM

## 2020-08-14 DIAGNOSIS — I872 Venous insufficiency (chronic) (peripheral): Secondary | ICD-10-CM

## 2020-08-14 DIAGNOSIS — I2510 Atherosclerotic heart disease of native coronary artery without angina pectoris: Secondary | ICD-10-CM

## 2020-08-14 DIAGNOSIS — Z7722 Contact with and (suspected) exposure to environmental tobacco smoke (acute) (chronic): Secondary | ICD-10-CM

## 2020-08-14 DIAGNOSIS — I10 Essential (primary) hypertension: Secondary | ICD-10-CM

## 2020-08-14 DIAGNOSIS — I25119 Atherosclerotic heart disease of native coronary artery with unspecified angina pectoris: Secondary | ICD-10-CM

## 2020-08-14 DIAGNOSIS — E785 Hyperlipidemia, unspecified: Secondary | ICD-10-CM

## 2020-08-14 DIAGNOSIS — E7800 Pure hypercholesterolemia, unspecified: Secondary | ICD-10-CM

## 2020-08-14 DIAGNOSIS — R011 Cardiac murmur, unspecified: Secondary | ICD-10-CM

## 2020-08-14 DIAGNOSIS — G4700 Insomnia, unspecified: Secondary | ICD-10-CM

## 2020-08-14 DIAGNOSIS — E039 Hypothyroidism, unspecified: Secondary | ICD-10-CM

## 2020-08-14 DIAGNOSIS — Z289 Immunization not carried out for unspecified reason: Secondary | ICD-10-CM

## 2020-08-14 DIAGNOSIS — R0989 Other specified symptoms and signs involving the circulatory and respiratory systems: Secondary | ICD-10-CM

## 2020-08-14 DIAGNOSIS — F329 Major depressive disorder, single episode, unspecified: Secondary | ICD-10-CM

## 2020-08-14 NOTE — Progress Notes
Date of Service: 08/14/2020    Lori Harper is a 86 y.o. female.       HPI     Lori Harper is an 86 year old white female with a history of carotid artery disease, CAD, status post CABG.  She was last seen in the office in November 2021.    Patient tells me today that she is doing well from a cardiac standpoint.  She has not had any symptoms of chest pain, no heart palpitations, she has not taken any sublingual nitroglycerin.    She was evaluated in the emergency room department on 06/15/2020, at that time she presented with warm sensation in her chest and back, it was felt that her symptoms were nondescript and vague.  Patient was ruled out for an ACS and she was discharged from the emergency room department in stable condition.    Patient was last evaluated with a perfusion imaging study in December 2019, the perfusion pattern was abnormal, indicated a large area of injury involving the entire lateral wall, associated WMA were present, ejection fraction was 62%.    By previous LHC she is known to have an occluded SVG to RCA, and that explains the pattern seen on the perfusion imaging study.    The most recent left heart catheterization was performed in October 2009, at that time she was found to have severe three-vessel CAD, patent LIMA to LAD, SVG to RCA was occluded, patent SVG to OM branch with occlusion of the second limb to OM two.    Patient does not report symptoms of angina, she has not taken sublingual nitroglycerin.  She is exposed to secondhand smoking, she lives with her son and her granddaughter that both heavily smoke.    She is also known to have bilateral nonobstructive carotid artery disease last evaluated by a carotid artery duplex in March 2021.    She does use a walker for ambulation, she has not had any falls.    Today in our office the blood pressure and heart rate are under good control.           Vitals:    08/14/20 0950   BP: 122/72   BP Source: Arm, Left Upper   Patient Position: Sitting   Pulse: 59   SpO2: 98%   Weight: 63.5 kg (140 lb)   Height: 149.9 cm (4' 11)   PainSc: Zero     Body mass index is 28.28 kg/m?Marland Kitchen     Past Medical History  Patient Active Problem List    Diagnosis Date Noted   ? Hospital discharge follow-up 04/17/2020   ? Secondhand exposure to electronic cigarette smoke 04/17/2020   ? Abnormal thallium stress test 06/22/2018   ? Carotid artery disease (HCC) 05/23/2018     05/18/2018 Carotid duplex Right:  There is calcified plaque at the proximal internal carotid artery causing less than 50 percent stenosis.  There is scattered noncalcified and calcified plaque causing less than 50 percent stenosis along the common carotid artery.  Left:  There is heterogeneous calcified and noncalcified plaque at the carotid bulb and extending into the proximal internal carotid artery causing less than 50 percent estimated grayscale stenosis.       ? Hx of CABG 04/27/2018   ? Systolic murmur 04/27/2018   ? Bruit of left carotid artery 04/27/2018   ? Walker as ambulation aid 04/27/2018   ? Aortic stenosis 04/21/2018     01/2005 Echo showed normal LV systolic function. LV is normal  in size.  Impaired LV relaxation.  Left atrium is mildly dilated.  There is mild mitral regurgitation by color flow.  There is mild mitral annular calcification      ? Coronary artery disease 04/21/2018     04/2000    1999 quintuple coronary artery bypass surgery including left internal mammary artery to the left anterior descending, sequential saphenous vein grafts to the first obtuse marginal, second obtuse marginal and first diagonal arteries, and a separate saphenous vein graft to the right coronary artery.    1999 Acute inferior wall MI.  07/1997  Occluded saphenous vein graft to the RCA.   04/2000  Occluded graft to the RCA  03/2001  Angioplasty with deployment of a total of four stents.  01/2203  Thallium showed inferior lateral and apical defect with mid to mod degree of ischemia suggesting peri-infarct ischemia.   01/2003  Cath demonstrated occlusion of all epicardial coronary arteries, patent left internal mammary artery graft to the LAD, patient saphenous vein graft to the first obtuse marginal with occluded sequential arms to the second obtuse marginal and to the diagonal artery, occluded saphenous vein graft to the RCA.  The patient has been treated medically.         ? Chronic kidney disease 04/21/2018   ? GERD (gastroesophageal reflux disease) 04/21/2018   ? Hypertension 04/21/2018   ? Hyperlipidemia 04/21/2018   ? Hypo-osmolality and hyponatremia 04/21/2018   ? Hypokalemia 04/21/2018   ? Hypothyroidism 04/21/2018   ? Insomnia 04/21/2018   ? MDD (major depressive disorder) 04/21/2018   ? Osteoporosis 04/21/2018   ? Venous insufficiency 04/21/2018   ? Chest pain 04/21/2018         Review of Systems   Constitutional: Negative.   HENT: Negative.    Eyes: Negative.    Cardiovascular: Positive for leg swelling.   Respiratory: Negative.    Endocrine: Negative.    Hematologic/Lymphatic: Negative.    Skin: Negative.    Musculoskeletal: Negative.    Gastrointestinal: Negative.    Genitourinary: Negative.    Neurological: Negative.    Psychiatric/Behavioral: Negative.    Allergic/Immunologic: Negative.        Physical Exam  General Appearance: normal in appearance , patient uses a walker as an ambulatory aid  Skin: warm, moist, no ulcers or xanthomas  Eyes: conjunctivae and lids normal, pupils are equal and round  Lips & Oral Mucosa: no pallor or cyanosis  Neck Veins: neck veins are flat, neck veins are not distended  Chest Inspection: chest is normal in appearance  Respiratory Effort: breathing comfortably, no respiratory distress  Auscultation/Percussion: lungs clear to auscultation, no rales or rhonchi, no wheezing  Cardiac Rhythm: regular rhythm and normal rate  Cardiac Auscultation: S1, S2 normal, no rub, no gallop  Murmurs: 2/6 systolic murmur at the right upper sternal border  Carotid Arteries: normal carotid upstroke bilaterally, bilateral carotid bruit  Lower Extremity Edema: no lower extremity edema  Abdominal Exam: soft, non-tender, no masses, bowel sounds normal  Liver & Spleen: no organomegaly  Language and Memory: patient responsive and seems to comprehend information  Neurologic Exam: neurological assessment grossly intact    Cardiovascular Studies      Cardiovascular Health Factors  Vitals BP Readings from Last 3 Encounters:   08/14/20 122/72   04/17/20 124/62   09/07/19 (!) 157/88     Wt Readings from Last 3 Encounters:   08/14/20 63.5 kg (140 lb)   04/17/20 64 kg (141 lb 3.2 oz)  09/07/19 59.2 kg (130 lb 9.6 oz)     BMI Readings from Last 3 Encounters:   08/14/20 28.28 kg/m?   04/17/20 28.52 kg/m?   09/07/19 26.38 kg/m?      Smoking Social History     Tobacco Use   Smoking Status Never Smoker   Smokeless Tobacco Never Used      Lipid Profile Cholesterol   Date Value Ref Range Status   01/19/2020 190  Final     HDL   Date Value Ref Range Status   01/19/2020 57  Final     LDL   Date Value Ref Range Status   01/19/2020 124 (H) <100 Final     Triglycerides   Date Value Ref Range Status   01/19/2020 133  Final      Blood Sugar No results found for: HGBA1C  Glucose   Date Value Ref Range Status   03/27/2020 107 (H) 70 - 105 Final   08/14/2019 87  Final   01/08/2019 93  Final          Problems Addressed Today  Encounter Diagnoses   Name Primary?   ? Nonrheumatic aortic valve stenosis Yes   ? Coronary artery disease involving native heart with angina pectoris, unspecified vessel or lesion type (HCC)    ? Primary hypertension    ? Pure hypercholesterolemia    ? Venous insufficiency    ? Hx of CABG    ? Carotid artery disease, unspecified laterality, unspecified type (HCC)    ? Chronic kidney disease, unspecified CKD stage    ? Systolic murmur    ? Bruit of left carotid artery    ? Walker as ambulation aid    ? Abnormal thallium stress test    ? Secondhand exposure to electronic cigarette smoke    ? COVID-19 vaccine first dose not administered        Assessment and Plan     In summary: This is an 86 year old white female that presents with the following cardiovascular issues:    1.  Coronary artery disease?at present time patient does not have any symptoms of angina, she has not taken any sublingual nitroglycerin  2.  Status post CABG?this was performed in 1990  3.  Status post LHC?the last procedure performed in October 2009 demonstrated severe three-vessel CAD, patent LIMA to LAD, occluded SVG to RCA, patent SVG to OM branch with occluded second LIMA to OM two  4.  Nonobstructive carotid artery disease?patient was evaluated by carotid, 2D echo Doppler study and follow-up office visit with me in approximately 6 months in March 2021  5.  Abnormal thallium stress test?the pattern noted on the study is compatible with patient's known anatomy, the last study was performed in December 2019  6.  Hyperlipidemia?patient is on statin therapy  7.  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  Patient uses a walker as an ambulatory aid    Plan:    1.  I asked her to continue all current medications  2.  I advised to restart risk modification  3.  Carotid artery duplex, 2D echo Doppler study prior to the next follow-up office visit with me in 6 to 8 months.      Total Time Today was 30 minutes in the following activities: Preparing to see the patient, Obtaining and/or reviewing separately obtained history, Performing a medically appropriate examination and/or evaluation, Counseling and educating the patient/family/caregiver, Ordering medications, tests, or procedures,  Referring and communication with other health care professionals (when not separately reported), Documenting clinical information in the electronic or other health record, Independently interpreting results (not separately reported) and communicating results to the patient/family/caregiver and Care coordination (not separately reported)         Current Medications (including today's revisions)  ? allopurinoL (ZYLOPRIM) 100 mg tablet Take 1 tablet by mouth daily.   ? aspirin EC 81 mg tablet Take 81 mg by mouth daily. Take with food.   ? buPROPion XL (WELLBUTRIN XL) 300 mg tablet Take 300 mg by mouth daily.   ? busPIRone (BUSPAR) 7.5 mg tablet Take 7.5 mg by mouth twice daily.   ? cholecalciferol(+) (VITAMIN D-3) 2,000 unit tablet Take 2,000 Units by mouth daily.   ? diclofenac (VOLTAREN) 1 % topical gel Apply 4 g topically to affected area four times daily.   ? famotidine (PEPCID) 40 mg tablet Take 40 mg by mouth daily.   ? fluticasone propionate (FLONASE) 50 mcg/actuation nasal spray Apply 2 sprays to each nostril as directed daily. Shake bottle gently before using.   ? furosemide (LASIX) 40 mg tablet Take 40 mg by mouth daily.   ? gabapentin (NEURONTIN) 300 mg capsule Take 1 capsule by mouth twice daily. Patient takes 1 capsule in the morning and 2 capsules in the evening   ? isosorbide mononitrate CR (IMDUR) 120 mg tablet Take 120 mg by mouth twice daily.   ? KLOR-CON M20 20 mEq tablet Take 20 mEq by mouth daily.   ? levothyroxine (SYNTHROID) 50 mcg tablet Take 50 mcg by mouth daily.   ? loratadine (CLARITIN) 10 mg tablet Take 10 mg by mouth every morning.   ? LORazepam (ATIVAN) 0.5 mg tablet Take 1 tablet by mouth every 12 hours as needed.   ? metoprolol tartrate (LOPRESSOR) 50 mg tablet TAKE 1 TABLET BY MOUTH IN THE MORNING AND 1/2 TABS ON THE EVENING   ? multivit,calc,mins/folic acid (ONE-A-DAY PROACTIVE 65 PLUS PO) Take 1 tablet by mouth daily.   ? nitroglycerin (NITROSTAT) 0.4 mg tablet Place one tablet under tongue every 5 minutes as needed for Chest Pain. Max of 3 tablets, call 911.   ? Omega-3-DHA-EPA-Fish Oil 1,000 mg (120 mg-180 mg) cap Take 1 capsule by mouth daily.   ? simvastatin (ZOCOR) 20 mg tablet Take 20 mg by mouth daily.   ? Trolamine Salicylate-Aloe Vera 10 % crea Apply  topically to affected area as Needed.   ? Vit A,C,E-Zinc-Copper (PRESERVISION AREDS) [phone] unit-mg-unit cap Take 1 capsule by mouth daily.

## 2021-01-23 ENCOUNTER — Ambulatory Visit: Admit: 2021-01-23 | Discharge: 2021-01-23 | Payer: MEDICARE | Primary: Family

## 2021-01-23 ENCOUNTER — Encounter: Admit: 2021-01-23 | Discharge: 2021-01-23 | Payer: MEDICARE | Primary: Family

## 2021-01-23 DIAGNOSIS — I10 Essential (primary) hypertension: Secondary | ICD-10-CM

## 2021-01-23 DIAGNOSIS — I25119 Atherosclerotic heart disease of native coronary artery with unspecified angina pectoris: Secondary | ICD-10-CM

## 2021-01-23 DIAGNOSIS — Z951 Presence of aortocoronary bypass graft: Secondary | ICD-10-CM

## 2021-01-23 DIAGNOSIS — I779 Disorder of arteries and arterioles, unspecified: Secondary | ICD-10-CM

## 2021-01-23 DIAGNOSIS — E7800 Pure hypercholesterolemia, unspecified: Secondary | ICD-10-CM

## 2021-01-23 MED ORDER — PERFLUTREN LIPID MICROSPHERES 1.1 MG/ML IV SUSP
1-10 mL | Freq: Once | INTRAVENOUS | 0 refills | Status: CP | PRN
Start: 2021-01-23 — End: ?

## 2021-01-26 ENCOUNTER — Ambulatory Visit: Admit: 2021-01-26 | Discharge: 2021-01-26 | Payer: MEDICARE | Primary: Family

## 2021-01-26 ENCOUNTER — Encounter: Admit: 2021-01-26 | Discharge: 2021-01-26 | Payer: MEDICARE | Primary: Family

## 2021-01-26 DIAGNOSIS — I779 Disorder of arteries and arterioles, unspecified: Secondary | ICD-10-CM

## 2021-01-26 DIAGNOSIS — R0989 Other specified symptoms and signs involving the circulatory and respiratory systems: Secondary | ICD-10-CM

## 2021-01-29 ENCOUNTER — Encounter: Admit: 2021-01-29 | Discharge: 2021-01-29 | Payer: MEDICARE | Primary: Family

## 2021-01-29 DIAGNOSIS — G4700 Insomnia, unspecified: Secondary | ICD-10-CM

## 2021-01-29 DIAGNOSIS — E785 Hyperlipidemia, unspecified: Secondary | ICD-10-CM

## 2021-01-29 DIAGNOSIS — F329 Major depressive disorder, single episode, unspecified: Secondary | ICD-10-CM

## 2021-01-29 DIAGNOSIS — N189 Chronic kidney disease, unspecified: Secondary | ICD-10-CM

## 2021-01-29 DIAGNOSIS — I872 Venous insufficiency (chronic) (peripheral): Secondary | ICD-10-CM

## 2021-01-29 DIAGNOSIS — M810 Age-related osteoporosis without current pathological fracture: Secondary | ICD-10-CM

## 2021-01-29 DIAGNOSIS — I350 Nonrheumatic aortic (valve) stenosis: Secondary | ICD-10-CM

## 2021-01-29 DIAGNOSIS — E876 Hypokalemia: Secondary | ICD-10-CM

## 2021-01-29 DIAGNOSIS — I2510 Atherosclerotic heart disease of native coronary artery without angina pectoris: Secondary | ICD-10-CM

## 2021-01-29 DIAGNOSIS — E039 Hypothyroidism, unspecified: Secondary | ICD-10-CM

## 2021-01-29 DIAGNOSIS — R079 Chest pain, unspecified: Secondary | ICD-10-CM

## 2021-01-29 DIAGNOSIS — I10 Essential (primary) hypertension: Secondary | ICD-10-CM

## 2021-01-29 DIAGNOSIS — K219 Gastro-esophageal reflux disease without esophagitis: Secondary | ICD-10-CM

## 2021-01-29 DIAGNOSIS — E871 Hypo-osmolality and hyponatremia: Secondary | ICD-10-CM

## 2021-07-15 ENCOUNTER — Encounter: Admit: 2021-07-15 | Discharge: 2021-07-15 | Payer: MEDICARE | Primary: Family

## 2022-03-12 ENCOUNTER — Encounter: Admit: 2022-03-12 | Discharge: 2022-03-12 | Payer: MEDICARE | Primary: Family

## 2022-03-12 NOTE — Telephone Encounter
Pt's son called requesting appointment at earliest opening.  He states that Lenny was evaluated for chest pain the [facility] and were told to call to get an appointment for follow-up with cardiology.  Scheduled pt for first available with [facility].  Pt's son confirmed appt time and location.

## 2022-03-15 ENCOUNTER — Encounter: Admit: 2022-03-15 | Discharge: 2022-03-15 | Payer: MEDICARE | Primary: Family

## 2022-03-16 ENCOUNTER — Encounter: Admit: 2022-03-16 | Discharge: 2022-03-16 | Payer: MEDICARE | Primary: Family

## 2022-03-16 DIAGNOSIS — I10 Essential (primary) hypertension: Secondary | ICD-10-CM

## 2022-03-16 DIAGNOSIS — E876 Hypokalemia: Secondary | ICD-10-CM

## 2022-03-16 DIAGNOSIS — Z951 Presence of aortocoronary bypass graft: Secondary | ICD-10-CM

## 2022-03-16 DIAGNOSIS — F329 Major depressive disorder, single episode, unspecified: Secondary | ICD-10-CM

## 2022-03-16 DIAGNOSIS — I872 Venous insufficiency (chronic) (peripheral): Secondary | ICD-10-CM

## 2022-03-16 DIAGNOSIS — Z79899 Other long term (current) drug therapy: Secondary | ICD-10-CM

## 2022-03-16 DIAGNOSIS — N189 Chronic kidney disease, unspecified: Secondary | ICD-10-CM

## 2022-03-16 DIAGNOSIS — E785 Hyperlipidemia, unspecified: Secondary | ICD-10-CM

## 2022-03-16 DIAGNOSIS — R079 Chest pain, unspecified: Secondary | ICD-10-CM

## 2022-03-16 DIAGNOSIS — K21 Gastroesophageal reflux disease with esophagitis without hemorrhage: Secondary | ICD-10-CM

## 2022-03-16 DIAGNOSIS — I2510 Atherosclerotic heart disease of native coronary artery without angina pectoris: Secondary | ICD-10-CM

## 2022-03-16 DIAGNOSIS — G4700 Insomnia, unspecified: Secondary | ICD-10-CM

## 2022-03-16 DIAGNOSIS — E871 Hypo-osmolality and hyponatremia: Secondary | ICD-10-CM

## 2022-03-16 DIAGNOSIS — K219 Gastro-esophageal reflux disease without esophagitis: Secondary | ICD-10-CM

## 2022-03-16 DIAGNOSIS — I350 Nonrheumatic aortic (valve) stenosis: Secondary | ICD-10-CM

## 2022-03-16 DIAGNOSIS — E039 Hypothyroidism, unspecified: Secondary | ICD-10-CM

## 2022-03-16 DIAGNOSIS — M810 Age-related osteoporosis without current pathological fracture: Secondary | ICD-10-CM

## 2022-03-16 MED ORDER — TRAZODONE 100 MG PO TAB
50 mg | ORAL_TABLET | Freq: Every evening | ORAL | 0 refills | Status: AC | PRN
Start: 2022-03-16 — End: ?

## 2022-03-16 MED ORDER — METOPROLOL SUCCINATE 50 MG PO TB24
50 mg | ORAL_TABLET | Freq: Every day | ORAL | 1 refills | 90.00000 days | Status: AC
Start: 2022-03-16 — End: ?

## 2022-03-16 MED ORDER — SPIRONOLACTONE 25 MG PO TAB
25 mg | ORAL_TABLET | Freq: Every day | ORAL | 1 refills | 90.00000 days | Status: AC
Start: 2022-03-16 — End: ?

## 2022-03-16 NOTE — Progress Notes
Date of Service: 03/16/2022    Lori Harper is a 87 y.o. female.       HPI     Patient is an 87 year old Caucasian female past medical history of coronary artery disease with previous CABG, hypertension, hypothyroidism, with history of gastroesophageal reflux disorder, no nonobstructive carotid artery disease and sleep disorder who presents after hospital presentation for a discomfort in her chest that started more lower sternal and moved up and down her sternum.  Did take nitroglycerin x1 with good improvement and a second nitroglycerin and with total resolution of discomfort.  She presented to the emergency room for full evaluation.  ECG was normal, cardiac markers were normal, CBC, and BMP did not reveal significant abnormality.  She otherwise has recovered to baseline without recurrent symptoms.  Is not had other chest pain, palpitation, or lightheadedness.  Orthopnea or PND.  Normal renal habits and normal bowel habits.  No bleeding or bruising concerns.  Is present with her granddaughter today         Vitals:    03/16/22 1249   BP: 110/62   BP Source: Arm, Left Upper   Pulse: 59   SpO2: 96%   O2 Device: None (Room air)   PainSc: Zero   Weight: 60.4 kg (133 lb 3.2 oz)   Height: 149.9 cm (4' 11)     Body mass index is 26.9 kg/m?Marland Kitchen     Past Medical History  Patient Active Problem List    Diagnosis Date Noted   ? COVID-19 vaccine first dose not administered 08/14/2020   ? Hospital discharge follow-up 04/17/2020   ? Secondhand smoke exposure 04/17/2020   ? Abnormal thallium stress test 06/22/2018   ? Carotid artery disease (HCC) 05/23/2018     05/18/2018 Carotid duplex Right:  There is calcified plaque at the proximal internal carotid artery causing less than 50 percent stenosis.  There is scattered noncalcified and calcified plaque causing less than 50 percent stenosis along the common carotid artery.  Left:  There is heterogeneous calcified and noncalcified plaque at the carotid bulb and extending into the proximal internal carotid artery causing less than 50 percent estimated grayscale stenosis.       ? Hx of CABG 04/27/2018   ? Systolic murmur 04/27/2018   ? Bruit of left carotid artery 04/27/2018   ? Walker as ambulation aid 04/27/2018   ? Coronary artery disease 04/21/2018     04/2000    1999 quintuple coronary artery bypass surgery including left internal mammary artery to the left anterior descending, sequential saphenous vein grafts to the first obtuse marginal, second obtuse marginal and first diagonal arteries, and a separate saphenous vein graft to the right coronary artery.    1999 Acute inferior wall MI.  07/1997  Occluded saphenous vein graft to the RCA.   04/2000  Occluded graft to the RCA  03/2001  Angioplasty with deployment of a total of four stents.  01/2203  Thallium showed inferior lateral and apical defect with mid to mod degree of ischemia suggesting peri-infarct ischemia.   01/2003  Cath demonstrated occlusion of all epicardial coronary arteries, patent left internal mammary artery graft to the LAD, patient saphenous vein graft to the first obtuse marginal with occluded sequential arms to the second obtuse marginal and to the diagonal artery, occluded saphenous vein graft to the RCA.  The patient has been treated medically.         ? Chronic kidney disease 04/21/2018   ? GERD (gastroesophageal reflux  disease) 04/21/2018   ? Hypertension 04/21/2018   ? Hyperlipidemia 04/21/2018   ? Hypo-osmolality and hyponatremia 04/21/2018   ? Hypokalemia 04/21/2018   ? Hypothyroidism 04/21/2018   ? Insomnia 04/21/2018   ? MDD (major depressive disorder) 04/21/2018   ? Osteoporosis 04/21/2018   ? Venous insufficiency 04/21/2018   ? Chest pain 04/21/2018         Review of Systems   Constitutional: Negative.   HENT: Negative.    Eyes: Negative.    Cardiovascular: Negative.    Respiratory: Negative.    Endocrine: Negative.    Hematologic/Lymphatic: Negative.    Skin: Negative.    Musculoskeletal: Negative. Gastrointestinal: Negative.    Genitourinary: Negative.    Neurological: Negative.    Psychiatric/Behavioral: Negative.    Allergic/Immunologic: Negative.        Physical Exam  Awake and alert no acute distress, very pleasant comfortable lady.  Chewing gum  Pupils are equal reactive without scleral injection  Neck is supple without bruit, no masses or jugular venous abnormalities  Chest is symmetric and well-healed with no focal deformity or producible pain with palpation.  Lungs clear to auscultation with good effort  Heart S1, S2 I do not appreciate significant murmur, click, or gallop  Abdomen is flat nontender with positive bowel sounds  Pulses are 2+, regular, and symmetric bilateral radial as well as dorsalis pedis and posterior tibial locations  No significant peripheral edema with skin turgor that is symmetric and normal with normal muscle tone    Cardiovascular Studies      Cardiovascular Health Factors  Vitals BP Readings from Last 3 Encounters:   03/16/22 110/62   01/29/21 124/70   01/26/21 128/60     Wt Readings from Last 3 Encounters:   03/16/22 60.4 kg (133 lb 3.2 oz)   01/29/21 62.6 kg (138 lb)   01/26/21 63.2 kg (139 lb 6.4 oz)     BMI Readings from Last 3 Encounters:   03/16/22 26.90 kg/m?   01/29/21 27.87 kg/m?   01/26/21 28.16 kg/m?      Smoking Social History     Tobacco Use   Smoking Status Never   Smokeless Tobacco Never      Lipid Profile Cholesterol   Date Value Ref Range Status   07/24/2020 105  Final     HDL   Date Value Ref Range Status   07/24/2020 41  Final     LDL   Date Value Ref Range Status   07/24/2020 88  Final     Triglycerides   Date Value Ref Range Status   07/24/2020 107  Final      Blood Sugar Hemoglobin A1C   Date Value Ref Range Status   07/24/2020 5.6  Final     Glucose   Date Value Ref Range Status   03/10/2022 124 (H) 70 - 105 Final   05/24/2021 96  Final   01/05/2021 114 (H) 70 - 105 Final          Problems Addressed Today  Encounter Diagnoses   Name Primary?   ? Gastroesophageal reflux disease with esophagitis without hemorrhage Yes   ? Hx of CABG    ? Chronic kidney disease, unspecified CKD stage    ? Polypharmacy        Assessment and Plan     Recent presentation for chest discomfort clinically consistent with gastroesophageal reflux episode.  No unstable cardiovascular findings are identified.  In review of patient records and data she  does have concerns for polypharmacy.  At this time would bring her off the famotidine and continue with the Protonix.  We will have her stop the gabapentin as she is not clear that actually helps with her neuropathy, does struggle with fatigue and general tiredness.  Is not having breakthrough anxiety or depressive type symptoms and otherwise sleep has been stable.  We will decrease her trazodone to 50 mg p.o. daily.  I am going to stop the metoprolol tartrate and start her on Toprol-XL at 50 mg p.o. daily.  We will bring her off the furosemide and potassium.  We will start her on spironolactone at 25 mg p.o. daily.  We will continue to monitor her blood pressure and electrolytes.  Reports she has blood work later this week and I will bring her back in 4 to 6 weeks to reassess her clinical status and recheck her BMP at that time.         Current Medications (including today's revisions)  ? acetaminophen (TYLENOL EXTRA STRENGTH) 500 mg tablet Take one tablet by mouth every 6 hours as needed for Pain. Max of 4,000 mg of acetaminophen in 24 hours.   ? allopurinoL (ZYLOPRIM) 100 mg tablet Take one tablet by mouth daily.   ? amLODIPine (NORVASC) 10 mg tablet Take one tablet by mouth daily.   ? aspirin EC 81 mg tablet Take one tablet by mouth daily. Take with food.   ? buPROPion XL (WELLBUTRIN XL) 300 mg tablet Take one tablet by mouth daily.   ? calcium carbonate (TUMS) 500 mg (200 mg elemental calcium) chewable tablet Chew one tablet by mouth daily as needed.   ? cholecalciferol (vitamin D3) (VITAMIN D3) 50 mcg (2,000 unit) tablet Take one tablet by mouth daily.   ? docusate (COLACE) 100 mg capsule Take one capsule by mouth daily.   ? fluticasone propionate (FLONASE) 50 mcg/actuation nasal spray Apply two sprays to each nostril as directed daily. Shake bottle gently before using.   ? isosorbide mononitrate CR (IMDUR) 120 mg tablet Take one tablet by mouth twice daily.   ? levothyroxine (SYNTHROID) 50 mcg tablet Take one tablet by mouth daily.   ? loperamide (IMODIUM A-D) 2 mg capsule Take one capsule by mouth as Needed for Diarrhea.   ? loratadine (CLARITIN) 10 mg tablet Take one tablet by mouth every morning.   ? metoprolol succinate XL (TOPROL XL) 50 mg extended release tablet Take one tablet by mouth daily.   ? multivit,calc,mins/folic acid (ONE-A-DAY PROACTIVE 65 PLUS PO) Take 1 tablet by mouth daily.   ? nitroglycerin (NITROSTAT) 0.4 mg tablet Place one tablet under tongue every 5 minutes as needed for Chest Pain. Max of 3 tablets, call 911.   ? OLANZapine (ZYPREXA) 2.5 mg tablet Take one tablet by mouth at bedtime daily.   ? pantoprazole DR (PROTONIX) 40 mg tablet Take one tablet by mouth daily.   ? simvastatin (ZOCOR) 20 mg tablet Take one tablet by mouth daily.   ? spironolactone (ALDACTONE) 25 mg tablet Take one tablet by mouth daily. Take with food.   ? traZODone (DESYREL) 100 mg tablet Take one-half tablet by mouth at bedtime as needed.   ? Trolamine Salicylate-Aloe Vera 10 % crea Apply  topically to affected area as Needed.

## 2022-03-19 ENCOUNTER — Encounter: Admit: 2022-03-19 | Discharge: 2022-03-19 | Payer: MEDICARE | Primary: Family

## 2022-03-24 ENCOUNTER — Encounter: Admit: 2022-03-24 | Discharge: 2022-03-24 | Payer: MEDICARE | Primary: Family

## 2022-03-26 ENCOUNTER — Encounter: Admit: 2022-03-26 | Discharge: 2022-03-26 | Payer: MEDICARE | Primary: Family

## 2022-03-26 NOTE — Telephone Encounter
Left message and requested callback to discuss results and recommendations.

## 2022-03-26 NOTE — Telephone Encounter
-----   Message from Lavonna Rua, RN sent at 03/26/2022 11:50 AM CDT -----    ----- Message -----  From: Shella Maxim, MD  Sent: 03/26/2022  11:46 AM CDT  To: [facility] Card Team [facility]    Blood work looks okay, potassium is borderline low, have her focus on eating foods that are potassium rich.  See if she is doing with other medication changes recently made.  Thank you  ----- Message -----  From: Baldwin Crown, RN  Sent: 03/19/2022   1:03 PM CDT  To: Shella Maxim, MD    Labs for your review, ordered by PCP.  At office visit on Tuesday, you stopped lasix and potassium and started spironolactone - she is due to check her labs in 2 weeks after those changes.  Please let me know if you have any additional recommendations.  Thank you!

## 2022-04-26 ENCOUNTER — Encounter: Admit: 2022-04-26 | Discharge: 2022-04-26 | Payer: MEDICARE | Primary: Family

## 2022-04-27 ENCOUNTER — Encounter: Admit: 2022-04-27 | Discharge: 2022-04-27 | Payer: MEDICARE | Primary: Family

## 2022-04-27 DIAGNOSIS — Z951 Presence of aortocoronary bypass graft: Secondary | ICD-10-CM

## 2022-04-27 DIAGNOSIS — I350 Nonrheumatic aortic (valve) stenosis: Secondary | ICD-10-CM

## 2022-04-27 DIAGNOSIS — K219 Gastro-esophageal reflux disease without esophagitis: Secondary | ICD-10-CM

## 2022-04-27 DIAGNOSIS — I2510 Atherosclerotic heart disease of native coronary artery without angina pectoris: Secondary | ICD-10-CM

## 2022-04-27 DIAGNOSIS — E876 Hypokalemia: Secondary | ICD-10-CM

## 2022-04-27 DIAGNOSIS — G4700 Insomnia, unspecified: Secondary | ICD-10-CM

## 2022-04-27 DIAGNOSIS — K21 Gastroesophageal reflux disease with esophagitis without hemorrhage: Secondary | ICD-10-CM

## 2022-04-27 DIAGNOSIS — I10 Essential (primary) hypertension: Secondary | ICD-10-CM

## 2022-04-27 DIAGNOSIS — R079 Chest pain, unspecified: Secondary | ICD-10-CM

## 2022-04-27 DIAGNOSIS — I872 Venous insufficiency (chronic) (peripheral): Secondary | ICD-10-CM

## 2022-04-27 DIAGNOSIS — N189 Chronic kidney disease, unspecified: Secondary | ICD-10-CM

## 2022-04-27 DIAGNOSIS — E785 Hyperlipidemia, unspecified: Secondary | ICD-10-CM

## 2022-04-27 DIAGNOSIS — E039 Hypothyroidism, unspecified: Secondary | ICD-10-CM

## 2022-04-27 DIAGNOSIS — F329 Major depressive disorder, single episode, unspecified: Secondary | ICD-10-CM

## 2022-04-27 DIAGNOSIS — E871 Hypo-osmolality and hyponatremia: Secondary | ICD-10-CM

## 2022-04-27 DIAGNOSIS — M810 Age-related osteoporosis without current pathological fracture: Secondary | ICD-10-CM

## 2022-04-27 DIAGNOSIS — Z79899 Other long term (current) drug therapy: Secondary | ICD-10-CM

## 2022-04-27 MED ORDER — ISOSORBIDE MONONITRATE 120 MG PO TB24
120 mg | ORAL_TABLET | Freq: Every morning | ORAL | 3 refills | 90.00000 days | Status: AC
Start: 2022-04-27 — End: ?

## 2022-04-27 MED ORDER — NITROGLYCERIN 0.4 MG SL SUBL
.4 mg | ORAL_TABLET | SUBLINGUAL | 1 refills | 9.00000 days | Status: AC | PRN
Start: 2022-04-27 — End: ?

## 2022-04-27 NOTE — Patient Instructions
Stop allopurinol, vitamin D, lactobacillus, and loratadine.    Stop daily Allegra, take as needed for worsening allergy symptoms  Decrease isosorbide to 1 tablet in the a.m.  Call with any questions or concerns  Routine follow-up can be in 3 months  Happy Thanksgiving

## 2022-04-27 NOTE — Progress Notes
Date of Service: 04/27/2022    Lori Harper is a 87 y.o. female.       HPI     Patient is an 87 year old Caucasian female past medical history of coronary artery disease with previous CABG in 2019, preserved left ventricular ejection fraction with sclerotic changes to the aortic valve but no significant stenosis.  She has a history of chronic renal insufficiency that has been stable, previous carotid bruit has been evaluated and found to be nonobstructive disease, patient does have some mild cognitive changes and findings for dementia.  Struggles with longstanding esophageal disorder and had previous dilatation of the esophagus because of obstruction, symptoms and findings for gastroesophageal reflux disease and has had treatment intermittently.  She was seen initially by me in October after she had an acute hospitalization to [facility].  Evaluation for chest pain at that time was negative for cardiac etiology and determined to be GI.  When I saw the patient in October was struggling from polypharmacy issues and multiple side effects.  She does take various medications for mood disorder and sleep disorder.  I was able to decrease several medications in discussion with the patient as well as her family members that live with her and accompany her today her sleep-wake cycle is much improved.  Was having symptoms of orthostatic hypotension where they stopped her amlodipine decreased from the Toprol.  Blood pressure is above goal but her symptoms of lightheadedness are much improved.  She still gets rare sticking of food in her throat about once or twice a week, then nothing that is began more progressive.  States she will get a fleeting chest pain that is kind of parasternal at the lower sternal level sporadically that also comes and goes without provocation and lasts less than 1 minute.  No bleeding or bruising concerns.  Reports arthritis symptoms and are otherwise stable.         Vitals:    04/27/22 1147   BP: (!) 168/82   BP Source: Arm, Left Upper   Pulse: 70   SpO2: 96%   O2 Device: None (Room air)   PainSc: Zero   Weight: 57.7 kg (127 lb 3.2 oz)   Height: 149.9 cm (4' 11)     Body mass index is 25.69 kg/m?Marland Kitchen     Past Medical History  Patient Active Problem List    Diagnosis Date Noted   ? COVID-19 vaccine first dose not administered 08/14/2020   ? Hospital discharge follow-up 04/17/2020   ? Secondhand smoke exposure 04/17/2020   ? Abnormal thallium stress test 06/22/2018   ? Carotid artery disease (HCC) 05/23/2018     05/18/2018 Carotid duplex Right:  There is calcified plaque at the proximal internal carotid artery causing less than 50 percent stenosis.  There is scattered noncalcified and calcified plaque causing less than 50 percent stenosis along the common carotid artery.  Left:  There is heterogeneous calcified and noncalcified plaque at the carotid bulb and extending into the proximal internal carotid artery causing less than 50 percent estimated grayscale stenosis.       ? Hx of CABG 04/27/2018   ? Systolic murmur 04/27/2018   ? Bruit of left carotid artery 04/27/2018   ? Walker as ambulation aid 04/27/2018   ? Coronary artery disease 04/21/2018     04/2000    1999 quintuple coronary artery bypass surgery including left internal mammary artery to the left anterior descending, sequential saphenous vein grafts to the first obtuse  marginal, second obtuse marginal and first diagonal arteries, and a separate saphenous vein graft to the right coronary artery.    1999 Acute inferior wall MI.  07/1997  Occluded saphenous vein graft to the RCA.   04/2000  Occluded graft to the RCA  03/2001  Angioplasty with deployment of a total of four stents.  01/2203  Thallium showed inferior lateral and apical defect with mid to mod degree of ischemia suggesting peri-infarct ischemia.   01/2003  Cath demonstrated occlusion of all epicardial coronary arteries, patent left internal mammary artery graft to the LAD, patient saphenous vein graft to the first obtuse marginal with occluded sequential arms to the second obtuse marginal and to the diagonal artery, occluded saphenous vein graft to the RCA.  The patient has been treated medically.         ? Chronic kidney disease 04/21/2018   ? GERD (gastroesophageal reflux disease) 04/21/2018   ? Hypertension 04/21/2018   ? Hyperlipidemia 04/21/2018   ? Hypo-osmolality and hyponatremia 04/21/2018   ? Hypokalemia 04/21/2018   ? Hypothyroidism 04/21/2018   ? Insomnia 04/21/2018   ? MDD (major depressive disorder) 04/21/2018   ? Osteoporosis 04/21/2018   ? Venous insufficiency 04/21/2018   ? Chest pain 04/21/2018         Review of Systems   Constitutional: Negative.   HENT: Negative.    Eyes: Negative.    Cardiovascular: Negative.    Respiratory: Negative.    Endocrine: Negative.    Hematologic/Lymphatic: Negative.    Skin: Negative.    Musculoskeletal: Negative.    Gastrointestinal: Negative.    Genitourinary: Negative.    Neurological: Negative.    Psychiatric/Behavioral: Negative.    Allergic/Immunologic: Negative.        Physical Exam  Awake and alert, no acute distress.  Appears given age is accompanied by family has her walker  Pupils are equal react without scleral injection  Neck is supple no carotid stroke and I do not appreciate significant bruit  Heart S1, S2 that are normal.  No significant murmur, click, or gallop  Chest is symmetric and lungs are clear to auscultation and no focal tenderness or deformity  Abdomen is mildly protuberant but soft  Pulse are 2+, regular, and symmetric bilaterally radial as well as pedal location  No significant peripheral edema with intact skin tone    Cardiovascular Studies      Cardiovascular Health Factors  Vitals BP Readings from Last 3 Encounters:   04/27/22 (!) 168/82   03/16/22 110/62   01/29/21 124/70     Wt Readings from Last 3 Encounters:   04/27/22 57.7 kg (127 lb 3.2 oz)   03/16/22 60.4 kg (133 lb 3.2 oz)   01/29/21 62.6 kg (138 lb) BMI Readings from Last 3 Encounters:   04/27/22 25.69 kg/m?   03/16/22 26.90 kg/m?   01/29/21 27.87 kg/m?      Smoking Social History     Tobacco Use   Smoking Status Never   Smokeless Tobacco Never      Lipid Profile Cholesterol   Date Value Ref Range Status   03/17/2022 205 (H) <200 Final     HDL   Date Value Ref Range Status   03/17/2022 34 (L) >=40 Final     LDL   Date Value Ref Range Status   03/17/2022 127 (H) <100 Final     Triglycerides   Date Value Ref Range Status   03/17/2022 223 (H) <150 Final  Blood Sugar Hemoglobin A1C   Date Value Ref Range Status   07/24/2020 5.6  Final     Glucose   Date Value Ref Range Status   04/19/2022 98  Final   03/17/2022 100  Final   03/10/2022 124 (H) 70 - 105 Final          Problems Addressed Today  Encounter Diagnoses   Name Primary?   ? Hx of CABG Yes   ? Polypharmacy    ? Gastroesophageal reflux disease with esophagitis without hemorrhage        Assessment and Plan     Clinically patient is overall doing better.  Her symptoms are still more consistent with GI etiology than cardiac abnormality.  We will limit her supplements to just a multivitamin daily, have her stop the allopurinol to try to limit gastritis symptoms.  Informed her to use an over-the-counter nondrowsy antihistamine as needed and continue with her nasal spray for her allergy symptoms.  Decrease her isosorbide to 120 mg every morning instead of twice daily.  At this time I did not make any changes to her blood pressure due to the recent orthopedic symptoms.  If she continues to have elevated blood pressure would restart her amlodipine at a decreased dose and continue to monitor symptoms and blood pressure.  I made no other changes to her therapy.  They can contact me any questions or concerns.  Routine follow-up can be in approximately 3 to 4 months.         Current Medications (including today's revisions)  ? acetaminophen (TYLENOL EXTRA STRENGTH) 500 mg tablet Take one tablet by mouth every 6 hours as needed for Pain. Max of 4,000 mg of acetaminophen in 24 hours.   ? aspirin EC 81 mg tablet Take one tablet by mouth daily. Take with food.   ? buPROPion XL (WELLBUTRIN XL) 300 mg tablet Take one tablet by mouth daily.   ? cholecalciferol (vitamin D3) (VITAMIN D3) 50 mcg (2,000 unit) tablet Take one tablet by mouth daily.   ? docusate (COLACE) 100 mg capsule Take one capsule by mouth daily.   ? fluticasone propionate (FLONASE) 50 mcg/actuation nasal spray Apply two sprays to each nostril as directed daily. Shake bottle gently before using.   ? isosorbide mononitrate ER (IMDUR) 120 mg tablet Take one tablet by mouth every morning.   ? levothyroxine (SYNTHROID) 50 mcg tablet Take one tablet by mouth daily.   ? loperamide (IMODIUM A-D) 2 mg capsule Take one capsule by mouth as Needed for Diarrhea.   ? loratadine (CLARITIN) 10 mg tablet Take one tablet by mouth every morning.   ? metoprolol succinate XL (TOPROL XL) 50 mg extended release tablet Take one tablet by mouth daily. (Patient taking differently: Take one-half tablet by mouth daily.)   ? multivit,calc,mins/folic acid (ONE-A-DAY PROACTIVE 65 PLUS PO) Take 1 tablet by mouth daily.   ? nitroglycerin (NITROSTAT) 0.4 mg tablet Place one tablet under tongue every 5 minutes as needed for Chest Pain. Max of 3 tablets, call 911.   ? OLANZapine (ZYPREXA) 2.5 mg tablet Take one tablet by mouth at bedtime daily.   ? pantoprazole DR (PROTONIX) 40 mg tablet Take one tablet by mouth daily.   ? simvastatin (ZOCOR) 20 mg tablet Take one tablet by mouth daily.   ? spironolactone (ALDACTONE) 25 mg tablet Take one tablet by mouth daily. Take with food.   ? traZODone (DESYREL) 100 mg tablet Take one-half tablet by mouth at bedtime as  needed.   ? Trolamine Salicylate-Aloe Vera 10 % crea Apply  topically to affected area as Needed.

## 2022-07-21 ENCOUNTER — Encounter: Admit: 2022-07-21 | Discharge: 2022-07-21 | Payer: MEDICARE | Primary: Family

## 2022-07-21 NOTE — Progress Notes
Records Request    Medical records request for continuation of care:    Patient has appointment  with  Dr. Lafe Garin    Please fax records to [facility] [place]    Request records:    Kadelyn Dimascio  1934-07-31      [place]:    CTA CHEST 06/2022    Thank you,      Cardiovascular Medicine  [facility]  [address] [place] [address]  Phone:  [phone]  Fax:  [phone]

## 2022-07-25 ENCOUNTER — Encounter: Admit: 2022-07-25 | Discharge: 2022-07-25 | Payer: MEDICARE | Primary: Family

## 2022-07-26 ENCOUNTER — Encounter: Admit: 2022-07-26 | Discharge: 2022-07-26 | Payer: MEDICARE | Primary: Family

## 2022-07-26 NOTE — Progress Notes
Records Request- STAT    Medical records request for continuation of care:    Patient has appointment with  Dr. Lafe Garin    Please fax records to [facility]    Request records:    Cele Lafollette  August 21, 1934    [place]:    ECHO  CT ABD/PEL  CT HEAD    Thank you,      Cardiovascular Medicine  [facility]  [address] [place] [address]  Phone:  [phone]  Fax:  [phone]

## 2022-07-27 ENCOUNTER — Encounter: Admit: 2022-07-27 | Discharge: 2022-07-27 | Payer: MEDICARE | Primary: Family

## 2022-08-01 ENCOUNTER — Encounter: Admit: 2022-08-01 | Discharge: 2022-08-01 | Payer: MEDICARE | Primary: Family
